# Patient Record
Sex: Female | Born: 1990 | Race: Black or African American | Hispanic: No | Marital: Married | Smoking: Former smoker
Health system: Southern US, Community
[De-identification: ages and names within clinical notes are randomized; demographics above are authoritative.]

## PROBLEM LIST (undated history)

## (undated) DIAGNOSIS — R87629 Unspecified abnormal cytological findings in specimens from vagina: Secondary | ICD-10-CM

## (undated) DIAGNOSIS — Z3490 Encounter for supervision of normal pregnancy, unspecified, unspecified trimester: Principal | ICD-10-CM

## (undated) HISTORY — DX: Unspecified abnormal cytological findings in specimens from vagina: R87.629

## (undated) HISTORY — DX: Encounter for supervision of normal pregnancy, unspecified, unspecified trimester: Z34.90

---

## 2009-10-27 HISTORY — PX: CERVICAL CONE BIOPSY: SUR198

## 2014-10-02 ENCOUNTER — Encounter: Payer: Self-pay | Admitting: Adult Health

## 2014-10-04 ENCOUNTER — Encounter: Payer: Self-pay | Admitting: Adult Health

## 2014-10-04 ENCOUNTER — Ambulatory Visit (INDEPENDENT_AMBULATORY_CARE_PROVIDER_SITE_OTHER): Payer: Medicaid Other | Admitting: Adult Health

## 2014-10-04 VITALS — BP 100/60 | Ht 68.0 in | Wt 124.5 lb

## 2014-10-04 DIAGNOSIS — Z3201 Encounter for pregnancy test, result positive: Secondary | ICD-10-CM

## 2014-10-04 DIAGNOSIS — Z3490 Encounter for supervision of normal pregnancy, unspecified, unspecified trimester: Secondary | ICD-10-CM

## 2014-10-04 HISTORY — DX: Encounter for supervision of normal pregnancy, unspecified, unspecified trimester: Z34.90

## 2014-10-04 LAB — POCT URINE PREGNANCY: PREG TEST UR: POSITIVE

## 2014-10-04 NOTE — Patient Instructions (Signed)
Second Trimester of Pregnancy The second trimester is from week 13 through week 28, months 4 through 6. The second trimester is often a time when you feel your best. Your body has also adjusted to being pregnant, and you begin to feel better physically. Usually, morning sickness has lessened or quit completely, you may have more energy, and you may have an increase in appetite. The second trimester is also a time when the fetus is growing rapidly. At the end of the sixth month, the fetus is about 9 inches long and weighs about 1 pounds. You will likely begin to feel the baby move (quickening) between 18 and 20 weeks of the pregnancy. BODY CHANGES Your body goes through many changes during pregnancy. The changes vary from woman to woman.   Your weight will continue to increase. You will notice your lower abdomen bulging out.  You may begin to get stretch marks on your hips, abdomen, and breasts.  You may develop headaches that can be relieved by medicines approved by your health care provider.  You may urinate more often because the fetus is pressing on your bladder.  You may develop or continue to have heartburn as a result of your pregnancy.  You may develop constipation because certain hormones are causing the muscles that push waste through your intestines to slow down.  You may develop hemorrhoids or swollen, bulging veins (varicose veins).  You may have back pain because of the weight gain and pregnancy hormones relaxing your joints between the bones in your pelvis and as a result of a shift in weight and the muscles that support your balance.  Your breasts will continue to grow and be tender.  Your gums may bleed and may be sensitive to brushing and flossing.  Dark spots or blotches (chloasma, mask of pregnancy) may develop on your face. This will likely fade after the baby is born.  A dark line from your belly button to the pubic area (linea nigra) may appear. This will likely fade  after the baby is born.  You may have changes in your hair. These can include thickening of your hair, rapid growth, and changes in texture. Some women also have hair loss during or after pregnancy, or hair that feels dry or thin. Your hair will most likely return to normal after your baby is born. WHAT TO EXPECT AT YOUR PRENATAL VISITS During a routine prenatal visit:  You will be weighed to make sure you and the fetus are growing normally.  Your blood pressure will be taken.  Your abdomen will be measured to track your baby's growth.  The fetal heartbeat will be listened to.  Any test results from the previous visit will be discussed. Your health care provider may ask you:  How you are feeling.  If you are feeling the baby move.  If you have had any abnormal symptoms, such as leaking fluid, bleeding, severe headaches, or abdominal cramping.  If you have any questions. Other tests that may be performed during your second trimester include:  Blood tests that check for:  Low iron levels (anemia).  Gestational diabetes (between 24 and 28 weeks).  Rh antibodies.  Urine tests to check for infections, diabetes, or protein in the urine.  An ultrasound to confirm the proper growth and development of the baby.  An amniocentesis to check for possible genetic problems.  Fetal screens for spina bifida and Down syndrome. HOME CARE INSTRUCTIONS   Avoid all smoking, herbs, alcohol, and unprescribed   drugs. These chemicals affect the formation and growth of the baby.  Follow your health care provider's instructions regarding medicine use. There are medicines that are either safe or unsafe to take during pregnancy.  Exercise only as directed by your health care provider. Experiencing uterine cramps is a good sign to stop exercising.  Continue to eat regular, healthy meals.  Wear a good support bra for breast tenderness.  Do not use hot tubs, steam rooms, or saunas.  Wear your  seat belt at all times when driving.  Avoid raw meat, uncooked cheese, cat litter boxes, and soil used by cats. These carry germs that can cause birth defects in the baby.  Take your prenatal vitamins.  Try taking a stool softener (if your health care provider approves) if you develop constipation. Eat more high-fiber foods, such as fresh vegetables or fruit and whole grains. Drink plenty of fluids to keep your urine clear or pale yellow.  Take warm sitz baths to soothe any pain or discomfort caused by hemorrhoids. Use hemorrhoid cream if your health care provider approves.  If you develop varicose veins, wear support hose. Elevate your feet for 15 minutes, 3-4 times a day. Limit salt in your diet.  Avoid heavy lifting, wear low heel shoes, and practice good posture.  Rest with your legs elevated if you have leg cramps or low back pain.  Visit your dentist if you have not gone yet during your pregnancy. Use a soft toothbrush to brush your teeth and be gentle when you floss.  A sexual relationship may be continued unless your health care provider directs you otherwise.  Continue to go to all your prenatal visits as directed by your health care provider. SEEK MEDICAL CARE IF:   You have dizziness.  You have mild pelvic cramps, pelvic pressure, or nagging pain in the abdominal area.  You have persistent nausea, vomiting, or diarrhea.  You have a bad smelling vaginal discharge.  You have pain with urination. SEEK IMMEDIATE MEDICAL CARE IF:   You have a fever.  You are leaking fluid from your vagina.  You have spotting or bleeding from your vagina.  You have severe abdominal cramping or pain.  You have rapid weight gain or loss.  You have shortness of breath with chest pain.  You notice sudden or extreme swelling of your face, hands, ankles, feet, or legs.  You have not felt your baby move in over an hour.  You have severe headaches that do not go away with  medicine.  You have vision changes. Document Released: 10/07/2001 Document Revised: 10/18/2013 Document Reviewed: 12/14/2012 [facility] Patient Information 2015 [place], [place]. This information is not intended to replace advice given to you by your health care provider. Make sure you discuss any questions you have with your health care provider. First Trimester of Pregnancy The first trimester of pregnancy is from week 1 until the end of week 12 (months 1 through 3). A week after a sperm fertilizes an egg, the egg will implant on the wall of the uterus. This embryo will begin to develop into a baby. Genes from you and your partner are forming the baby. The female genes determine whether the baby is a boy or a girl. At 6-8 weeks, the eyes and face are formed, and the heartbeat can be seen on ultrasound. At the end of 12 weeks, all the baby's organs are formed.  Now that you are pregnant, you will want to do everything you can to have  a healthy baby. Two of the most important things are to get good prenatal care and to follow your health care provider's instructions. Prenatal care is all the medical care you receive before the baby's birth. This care will help prevent, find, and treat any problems during the pregnancy and childbirth. BODY CHANGES Your body goes through many changes during pregnancy. The changes vary from woman to woman.   You may gain or lose a couple of pounds at first.  You may feel sick to your stomach (nauseous) and throw up (vomit). If the vomiting is uncontrollable, call your health care provider.  You may tire easily.  You may develop headaches that can be relieved by medicines approved by your health care provider.  You may urinate more often. Painful urination may mean you have a bladder infection.  You may develop heartburn as a result of your pregnancy.  You may develop constipation because certain hormones are causing the muscles that push waste through your intestines  to slow down.  You may develop hemorrhoids or swollen, bulging veins (varicose veins).  Your breasts may begin to grow larger and become tender. Your nipples may stick out more, and the tissue that surrounds them (areola) may become darker.  Your gums may bleed and may be sensitive to brushing and flossing.  Dark spots or blotches (chloasma, mask of pregnancy) may develop on your face. This will likely fade after the baby is born.  Your menstrual periods will stop.  You may have a loss of appetite.  You may develop cravings for certain kinds of food.  You may have changes in your emotions from day to day, such as being excited to be pregnant or being concerned that something may go wrong with the pregnancy and baby.  You may have more vivid and strange dreams.  You may have changes in your hair. These can include thickening of your hair, rapid growth, and changes in texture. Some women also have hair loss during or after pregnancy, or hair that feels dry or thin. Your hair will most likely return to normal after your baby is born. WHAT TO EXPECT AT YOUR PRENATAL VISITS During a routine prenatal visit:  You will be weighed to make sure you and the baby are growing normally.  Your blood pressure will be taken.  Your abdomen will be measured to track your baby's growth.  The fetal heartbeat will be listened to starting around week 10 or 12 of your pregnancy.  Test results from any previous visits will be discussed. Your health care provider may ask you:  How you are feeling.  If you are feeling the baby move.  If you have had any abnormal symptoms, such as leaking fluid, bleeding, severe headaches, or abdominal cramping.  If you have any questions. Other tests that may be performed during your first trimester include:  Blood tests to find your blood type and to check for the presence of any previous infections. They will also be used to check for low iron levels (anemia) and  Rh antibodies. Later in the pregnancy, blood tests for diabetes will be done along with other tests if problems develop.  Urine tests to check for infections, diabetes, or protein in the urine.  An ultrasound to confirm the proper growth and development of the baby.  An amniocentesis to check for possible genetic problems.  Fetal screens for spina bifida and Down syndrome.  You may need other tests to make sure you and the baby  are doing well. HOME CARE INSTRUCTIONS  Medicines  Follow your health care provider's instructions regarding medicine use. Specific medicines may be either safe or unsafe to take during pregnancy.  Take your prenatal vitamins as directed.  If you develop constipation, try taking a stool softener if your health care provider approves. Diet  Eat regular, well-balanced meals. Choose a variety of foods, such as meat or vegetable-based protein, fish, milk and low-fat dairy products, vegetables, fruits, and whole grain breads and cereals. Your health care provider will help you determine the amount of weight gain that is right for you.  Avoid raw meat and uncooked cheese. These carry germs that can cause birth defects in the baby.  Eating four or five small meals rather than three large meals a day may help relieve nausea and vomiting. If you start to feel nauseous, eating a few soda crackers can be helpful. Drinking liquids between meals instead of during meals also seems to help nausea and vomiting.  If you develop constipation, eat more high-fiber foods, such as fresh vegetables or fruit and whole grains. Drink enough fluids to keep your urine clear or pale yellow. Activity and Exercise  Exercise only as directed by your health care provider. Exercising will help you:  Control your weight.  Stay in shape.  Be prepared for labor and delivery.  Experiencing pain or cramping in the lower abdomen or low back is a good sign that you should stop exercising. Check  with your health care provider before continuing normal exercises.  Try to avoid standing for long periods of time. Move your legs often if you must stand in one place for a long time.  Avoid heavy lifting.  Wear low-heeled shoes, and practice good posture.  You may continue to have sex unless your health care provider directs you otherwise. Relief of Pain or Discomfort  Wear a good support bra for breast tenderness.   Take warm sitz baths to soothe any pain or discomfort caused by hemorrhoids. Use hemorrhoid cream if your health care provider approves.   Rest with your legs elevated if you have leg cramps or low back pain.  If you develop varicose veins in your legs, wear support hose. Elevate your feet for 15 minutes, 3-4 times a day. Limit salt in your diet. Prenatal Care  Schedule your prenatal visits by the twelfth week of pregnancy. They are usually scheduled monthly at first, then more often in the last 2 months before delivery.  Write down your questions. Take them to your prenatal visits.  Keep all your prenatal visits as directed by your health care provider. Safety  Wear your seat belt at all times when driving.  Make a list of emergency phone numbers, including numbers for family, friends, the hospital, and police and fire departments. General Tips  Ask your health care provider for a referral to a local prenatal education class. Begin classes no later than at the beginning of month 6 of your pregnancy.  Ask for help if you have counseling or nutritional needs during pregnancy. Your health care provider can offer advice or refer you to specialists for help with various needs.  Do not use hot tubs, steam rooms, or saunas.  Do not douche or use tampons or scented sanitary pads.  Do not cross your legs for long periods of time.  Avoid cat litter boxes and soil used by cats. These carry germs that can cause birth defects in the baby and possibly loss of the fetus  by miscarriage or stillbirth.  Avoid all smoking, herbs, alcohol, and medicines not prescribed by your health care provider. Chemicals in these affect the formation and growth of the baby.  Schedule a dentist appointment. At home, brush your teeth with a soft toothbrush and be gentle when you floss. SEEK MEDICAL CARE IF:   You have dizziness.  You have mild pelvic cramps, pelvic pressure, or nagging pain in the abdominal area.  You have persistent nausea, vomiting, or diarrhea.  You have a bad smelling vaginal discharge.  You have pain with urination.  You notice increased swelling in your face, hands, legs, or ankles. SEEK IMMEDIATE MEDICAL CARE IF:   You have a fever.  You are leaking fluid from your vagina.  You have spotting or bleeding from your vagina.  You have severe abdominal cramping or pain.  You have rapid weight gain or loss.  You vomit blood or material that looks like coffee grounds.  You are exposed to [place] measles and have never had them.  You are exposed to fifth disease or chickenpox.  You develop a severe headache.  You have shortness of breath.  You have any kind of trauma, such as from a fall or a car accident. Document Released: 10/07/2001 Document Revised: 02/27/2014 Document Reviewed: 08/23/2013 [facility] Patient Information 2015 [place]. This information is not intended to replace advice given to you by your health care provider. Make sure you discuss any questions you have with your health care provider. Return in 1 days for dating [place]

## 2014-10-04 NOTE — Progress Notes (Signed)
Subjective:     Patient ID: Kristina Suarez, female   DOB: 05/13/1991, 23 y.o.   MRN: [id number]  HPI Kristina Suarez is a 23 year old black female in for UPT.  Review of Systems See HPI Reviewed past medical,surgical, social and family history. Reviewed medications and allergies.     Objective:   Physical Exam BP 100/60 mmHg  Ht 5\' 8"  (1.727 m)  Wt 124 lb 8 oz (56.473 kg)  BMI 18.93 kg/m2  LMP 06/29/2014   UPT +, about 14 weeks by LMP with EDD 04/05/15, medicaid form given, some nausea, no vomiting.  Assessment:     Pregnant +UPT    Plan:     Take prenatal gummies Return in 1 day for dating US Review handout on first and second trimester

## 2014-10-05 ENCOUNTER — Other Ambulatory Visit: Payer: Self-pay | Admitting: Adult Health

## 2014-10-05 ENCOUNTER — Ambulatory Visit (INDEPENDENT_AMBULATORY_CARE_PROVIDER_SITE_OTHER): Payer: Medicaid Other

## 2014-10-05 DIAGNOSIS — O26841 Uterine size-date discrepancy, first trimester: Secondary | ICD-10-CM

## 2014-10-05 DIAGNOSIS — Z3490 Encounter for supervision of normal pregnancy, unspecified, unspecified trimester: Secondary | ICD-10-CM

## 2014-10-05 NOTE — Progress Notes (Signed)
U/S-single IUP with +FCA noted, FHR-181 bpm,,CRL c/w 10+1wks EDD 05/02/2015, cx appears closed (3.2cm), small area of hemorrhage noted within the lower uterine segment=9014mm, bilateral adnexa appears WNL, pt desires NT/IT screening, will be scheduled with New OB appt

## 2014-10-18 ENCOUNTER — Other Ambulatory Visit: Payer: Self-pay | Admitting: Adult Health

## 2014-10-18 DIAGNOSIS — O3680X0 Pregnancy with inconclusive fetal viability, not applicable or unspecified: Secondary | ICD-10-CM

## 2014-10-26 ENCOUNTER — Encounter: Payer: Medicaid Other | Admitting: Advanced Practice Midwife

## 2014-10-26 ENCOUNTER — Other Ambulatory Visit: Payer: Medicaid Other
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
## 2014-10-31 ENCOUNTER — Other Ambulatory Visit: Payer: Medicaid Other

## 2014-10-31 ENCOUNTER — Other Ambulatory Visit: Payer: Self-pay | Admitting: Adult Health

## 2014-10-31 DIAGNOSIS — Z3682 Encounter for antenatal screening for nuchal translucency: Secondary | ICD-10-CM

## 2014-11-01 ENCOUNTER — Other Ambulatory Visit: Payer: Medicaid Other

## 2014-11-01 ENCOUNTER — Ambulatory Visit (INDEPENDENT_AMBULATORY_CARE_PROVIDER_SITE_OTHER): Payer: Medicaid Other

## 2014-11-01 DIAGNOSIS — Z36 Encounter for antenatal screening of mother: Secondary | ICD-10-CM

## 2014-11-01 DIAGNOSIS — Z0184 Encounter for antibody response examination: Secondary | ICD-10-CM

## 2014-11-01 DIAGNOSIS — Z130 Encounter for screening for diseases of the blood and blood-forming organs and certain disorders involving the immune mechanism: Secondary | ICD-10-CM

## 2014-11-01 DIAGNOSIS — Z3682 Encounter for antenatal screening for nuchal translucency: Secondary | ICD-10-CM

## 2014-11-01 DIAGNOSIS — Z114 Encounter for screening for human immunodeficiency virus [HIV]: Secondary | ICD-10-CM

## 2014-11-01 DIAGNOSIS — Z113 Encounter for screening for infections with a predominantly sexual mode of transmission: Secondary | ICD-10-CM

## 2014-11-01 DIAGNOSIS — Z3482 Encounter for supervision of other normal pregnancy, second trimester: Secondary | ICD-10-CM

## 2014-11-01 DIAGNOSIS — Z331 Pregnant state, incidental: Secondary | ICD-10-CM

## 2014-11-01 NOTE — Addendum Note (Signed)
Addended by: Criss AlvinePULLIAM, Keyshon Stein G on: 11/01/2014 02:58 PM   Modules accepted: Orders

## 2014-11-01 NOTE — Progress Notes (Signed)
Nuchal Translucency US performed today.  Single, active female IUP at 19+[redacted] weeks GA in a cephalic presentation.  FHR 132 bpm.  Cervix is closed and measures 3.4 cm.  Fluid is within normal limits with a SVP of 5.24.  Anterior fundal Gr 0 placenta.  Right ovary is within normal limits. Left ovary not visualized. All anatomy visualized and appears normal. EFW today of 362g (13oz) which is consistent with dating.

## 2014-11-02 LAB — CBC
HCT: 37.5 % (ref 36.0–46.0)
Hemoglobin: 12.4 g/dL (ref 12.0–15.0)
MCH: 30.1 pg (ref 26.0–34.0)
MCHC: 33.1 g/dL (ref 30.0–36.0)
MCV: 91 fL (ref 78.0–100.0)
MPV: 11.1 fL (ref 8.6–12.4)
Platelets: 246 10*3/uL (ref 150–400)
RBC: 4.12 MIL/uL (ref 3.87–5.11)
RDW: 14.2 % (ref 11.5–15.5)
WBC: 7.8 10*3/uL (ref 4.0–10.5)

## 2014-11-02 LAB — SICKLE CELL SCREEN: SICKLE CELL SCREEN: NEGATIVE

## 2014-11-02 LAB — RUBELLA SCREEN: Rubella: 1.01 Index — ABNORMAL HIGH (ref <0.90)

## 2014-11-02 LAB — VARICELLA ZOSTER ANTIBODY, IGG: Varicella IgG: 238.6 Index — ABNORMAL HIGH (ref <135.00)

## 2014-11-02 LAB — ABO AND RH: RH TYPE: POSITIVE

## 2014-11-02 LAB — RPR

## 2014-11-02 LAB — HIV ANTIBODY (ROUTINE TESTING W REFLEX): HIV: NONREACTIVE

## 2014-11-02 LAB — HEPATITIS B SURFACE ANTIGEN: Hepatitis B Surface Ag: NEGATIVE

## 2014-11-02 LAB — ANTIBODY SCREEN: ANTIBODY SCREEN: NEGATIVE

## 2014-11-06 ENCOUNTER — Ambulatory Visit (INDEPENDENT_AMBULATORY_CARE_PROVIDER_SITE_OTHER): Payer: Medicaid Other | Admitting: Women's Health

## 2014-11-06 ENCOUNTER — Encounter: Payer: Self-pay | Admitting: Women's Health

## 2014-11-06 VITALS — BP 110/70 | Wt 127.0 lb

## 2014-11-06 DIAGNOSIS — Z3490 Encounter for supervision of normal pregnancy, unspecified, unspecified trimester: Secondary | ICD-10-CM | POA: Insufficient documentation

## 2014-11-06 DIAGNOSIS — Z3492 Encounter for supervision of normal pregnancy, unspecified, second trimester: Secondary | ICD-10-CM

## 2014-11-06 DIAGNOSIS — Z1159 Encounter for screening for other viral diseases: Secondary | ICD-10-CM

## 2014-11-06 DIAGNOSIS — Z118 Encounter for screening for other infectious and parasitic diseases: Secondary | ICD-10-CM

## 2014-11-06 DIAGNOSIS — Z1371 Encounter for nonprocreative screening for genetic disease carrier status: Secondary | ICD-10-CM

## 2014-11-06 DIAGNOSIS — Z331 Pregnant state, incidental: Secondary | ICD-10-CM

## 2014-11-06 DIAGNOSIS — Z1389 Encounter for screening for other disorder: Secondary | ICD-10-CM

## 2014-11-06 DIAGNOSIS — Z0283 Encounter for blood-alcohol and blood-drug test: Secondary | ICD-10-CM

## 2014-11-06 DIAGNOSIS — Z23 Encounter for immunization: Secondary | ICD-10-CM

## 2014-11-06 LAB — POCT URINALYSIS DIPSTICK
Blood, UA: NEGATIVE
Glucose, UA: NEGATIVE
LEUKOCYTES UA: NEGATIVE
Nitrite, UA: NEGATIVE
Protein, UA: NEGATIVE

## 2014-11-06 MED ORDER — DOXYLAMINE-PYRIDOXINE 10-10 MG PO TBEC: DELAYED_RELEASE_TABLET | ORAL | Status: DC

## 2014-11-06 NOTE — Progress Notes (Signed)
  Subjective:  Kristina Suarez is a 24 y.o. 353P2002 African American female at 4765w5d by 10wk u/s, being seen today for her first obstetrical visit.  Her obstetrical history is significant for term uncomplicated SVB x 2 in [place].  Pregnancy history fully reviewed.  Patient reports n/v- would like meds. Denies vb, cramping, uti s/s, abnormal/malodorous vag d/c, or vulvovaginal itching/irritation.  BP 110/70 mmHg  Wt 127 lb (57.607 kg)  LMP 06/29/2014  HISTORY: OB History  Gravida Para Term Preterm AB SAB TAB Ectopic Multiple Living  3 2 2       2     # Outcome Date GA Lbr Len/2nd Weight Sex Delivery Anes PTL Lv  3 Current           2 Term 08/07/13 8665w0d  6 lb 11 oz (3.033 kg) M Vag-Spont  N   1 Term 06/23/09 3965w0d  6 lb 12 oz (3.062 kg) M Vag-Spont  N      Past Medical History  Diagnosis Date  . Vaginal Pap smear, abnormal   . Pregnant 10/04/2014   Past Surgical History  Procedure Laterality Date  . Cervical cone biopsy  2011   Family History  Problem Relation Age of Onset  . Diabetes Maternal Grandmother   . Hypertension Maternal Grandmother   . Other Maternal Grandmother     heart problems  . HIV Mother   . ADD / ADHD Sister     Exam   System:     General: Well developed & nourished, no acute distress   Skin: Warm & dry, normal coloration and turgor, no rashes   Neurologic: Alert & oriented, normal mood   Cardiovascular: Regular rate & rhythm   Respiratory: Effort & rate normal, LCTAB, acyanotic   Abdomen: Soft, non tender   Extremities: normal strength, tone   Pelvic Exam:    Perineum: Normal perineum   Vulva: Normal, no lesions   Vagina:  Normal mucosa, normal discharge   Cervix: Normal, bulbous, appears closed   Uterus: Normal size/shape/contour for GA   Thin prep pap smear neg Nov 2014 in [place]  FHR: 141 via doppler   Assessment:   Pregnancy: [id number] Patient Active Problem List   Diagnosis Date Noted  . Supervision of normal pregnancy 11/06/2014    Priority:  High    7365w5d [id number] New OB visit N/V of pregnancy   Plan:  Initial labs drawn 11/01/13 and reviewed Continue prenatal vitamins Problem list reviewed and updated Reviewed n/v relief measures and warning s/s to report Rx diclegis, prior auth sent, and out of samples so none given.  Reviewed recommended weight gain based on pre-gravid BMI Encouraged well-balanced diet Genetic Screening discussed Integrated Screen: has already had 1st IT/NT Cystic fibrosis screening discussed requested- will do w/ 2nd IT since has already had all other labs Ultrasound discussed; fetal survey: requested Follow up in 2 weeks for 2nd IT and visit [facility] completed  Marge Suarez, Kristina Suarez CNM, RaLPh [facility] CenterWHNP-BC 11/06/2014 2:49 PM

## 2014-11-06 NOTE — Patient Instructions (Addendum)
Nausea & Vomiting  Have saltine crackers or pretzels by your bed and eat a few bites before you raise your head out of bed in the morning  Eat small frequent meals throughout the day instead of large meals  Drink plenty of fluids throughout the day to stay hydrated, just don't drink a lot of fluids with your meals.  This can make your stomach fill up faster making you feel sick  Do not brush your teeth right after you eat  Products with real ginger are good for nausea, like ginger ale and ginger hard candy Make sure it says made with real ginger!  Sucking on sour candy like lemon heads is also good for nausea  If your prenatal vitamins make you nauseated, take them at night so you will sleep through the nausea  Sea Bands  If you feel like you need medicine for the nausea & vomiting please let us know  If you are unable to keep any fluids or food down please let us know    Second Trimester of Pregnancy The second trimester is from week 13 through week 28, months 4 through 6. The second trimester is often a time when you feel your best. Your body has also adjusted to being pregnant, and you begin to feel better physically. Usually, morning sickness has lessened or quit completely, you may have more energy, and you may have an increase in appetite. The second trimester is also a time when the fetus is growing rapidly. At the end of the sixth month, the fetus is about 9 inches long and weighs about 1 pounds. You will likely begin to feel the baby move (quickening) between 18 and 20 weeks of the pregnancy. BODY CHANGES Your body goes through many changes during pregnancy. The changes vary from woman to woman.  11. Your weight will continue to increase. You will notice your lower abdomen bulging out. 12. You may begin to get stretch marks on your hips, abdomen, and breasts. 13. You may develop headaches that can be relieved by medicines approved by your health care provider. 14. You may  urinate more often because the fetus is pressing on your bladder. 15. You may develop or continue to have heartburn as a result of your pregnancy. 16. You may develop constipation because certain hormones are causing the muscles that push waste through your intestines to slow down. 17. You may develop hemorrhoids or swollen, bulging veins (varicose veins). 18. You may have back pain because of the weight gain and pregnancy hormones relaxing your joints between the bones in your pelvis and as a result of a shift in weight and the muscles that support your balance. 19. Your breasts will continue to grow and be tender. 20. Your gums may bleed and may be sensitive to brushing and flossing. 21. Dark spots or blotches (chloasma, mask of pregnancy) may develop on your face. This will likely fade after the baby is born. 22. A dark line from your belly button to the pubic area (linea nigra) may appear. This will likely fade after the baby is born. 23. You may have changes in your hair. These can include thickening of your hair, rapid growth, and changes in texture. Some women also have hair loss during or after pregnancy, or hair that feels dry or thin. Your hair will most likely return to normal after your baby is born. WHAT TO EXPECT AT YOUR PRENATAL VISITS During a routine prenatal visit:  You will be weighed to   make sure you and the fetus are growing normally.  Your blood pressure will be taken.  Your abdomen will be measured to track your baby's growth.  The fetal heartbeat will be listened to.  Any test results from the previous visit will be discussed. Your health care provider may ask you:  How you are feeling.  If you are feeling the baby move.  If you have had any abnormal symptoms, such as leaking fluid, bleeding, severe headaches, or abdominal cramping.  If you have any questions. Other tests that may be performed during your second trimester include:  Blood tests that check  for:  Low iron levels (anemia).  Gestational diabetes (between 24 and 28 weeks).  Rh antibodies.  Urine tests to check for infections, diabetes, or protein in the urine.  An ultrasound to confirm the proper growth and development of the baby.  An amniocentesis to check for possible genetic problems.  Fetal screens for spina bifida and Down syndrome. HOME CARE INSTRUCTIONS   Avoid all smoking, herbs, alcohol, and unprescribed drugs. These chemicals affect the formation and growth of the baby.  Follow your health care provider's instructions regarding medicine use. There are medicines that are either safe or unsafe to take during pregnancy.  Exercise only as directed by your health care provider. Experiencing uterine cramps is a good sign to stop exercising.  Continue to eat regular, healthy meals.  Wear a good support bra for breast tenderness.  Do not use hot tubs, steam rooms, or saunas.  Wear your seat belt at all times when driving.  Avoid raw meat, uncooked cheese, cat litter boxes, and soil used by cats. These carry germs that can cause birth defects in the baby.  Take your prenatal vitamins.  Try taking a stool softener (if your health care provider approves) if you develop constipation. Eat more high-fiber foods, such as fresh vegetables or fruit and whole grains. Drink plenty of fluids to keep your urine clear or pale yellow.  Take warm sitz baths to soothe any pain or discomfort caused by hemorrhoids. Use hemorrhoid cream if your health care provider approves.  If you develop varicose veins, wear support hose. Elevate your feet for 15 minutes, 3-4 times a day. Limit salt in your diet.  Avoid heavy lifting, wear low heel shoes, and practice good posture.  Rest with your legs elevated if you have leg cramps or low back pain.  Visit your dentist if you have not gone yet during your pregnancy. Use a soft toothbrush to brush your teeth and be gentle when you  floss.  A sexual relationship may be continued unless your health care provider directs you otherwise.  Continue to go to all your prenatal visits as directed by your health care provider. SEEK MEDICAL CARE IF:   You have dizziness.  You have mild pelvic cramps, pelvic pressure, or nagging pain in the abdominal area.  You have persistent nausea, vomiting, or diarrhea.  You have a bad smelling vaginal discharge.  You have pain with urination. SEEK IMMEDIATE MEDICAL CARE IF:   You have a fever.  You are leaking fluid from your vagina.  You have spotting or bleeding from your vagina.  You have severe abdominal cramping or pain.  You have rapid weight gain or loss.  You have shortness of breath with chest pain.  You notice sudden or extreme swelling of your face, hands, ankles, feet, or legs.  You have not felt your baby move in over an hour.  You have severe headaches that do not go away with medicine.  You have vision changes. Document Released: 10/07/2001 Document Revised: 10/18/2013 Document Reviewed: 12/14/2012 [facility] Patient Information 2015 [place], [place]. This information is not intended to replace advice given to you by your health care provider. Make sure you discuss any questions you have with your health care provider.

## 2014-11-07 ENCOUNTER — Encounter: Payer: Self-pay | Admitting: Women's Health

## 2014-11-07 DIAGNOSIS — F1290 Cannabis use, unspecified, uncomplicated: Secondary | ICD-10-CM | POA: Insufficient documentation

## 2014-11-07 LAB — DRUG SCREEN, URINE, NO CONFIRMATION
AMPHETAMINE SCRN UR: NEGATIVE
BENZODIAZEPINES.: NEGATIVE
Barbiturate Quant, Ur: NEGATIVE
COCAINE METABOLITES: NEGATIVE
Creatinine,U: 165.2 mg/dL
METHADONE: NEGATIVE
Marijuana Metabolite: POSITIVE — AB
Opiate Screen, Urine: NEGATIVE
Phencyclidine (PCP): NEGATIVE
Propoxyphene: NEGATIVE

## 2014-11-07 LAB — URINALYSIS, ROUTINE W REFLEX MICROSCOPIC
Bilirubin Urine: NEGATIVE
Glucose, UA: NEGATIVE mg/dL
Hgb urine dipstick: NEGATIVE
Ketones, ur: >80 mg/dL — AB
Leukocytes, UA: NEGATIVE
NITRITE: NEGATIVE
Protein, ur: NEGATIVE mg/dL
SPECIFIC GRAVITY, URINE: 1.021 (ref 1.005–1.030)
UROBILINOGEN UA: 1 mg/dL (ref 0.0–1.0)
pH: 7 (ref 5.0–8.0)

## 2014-11-07 LAB — GC/CHLAMYDIA PROBE AMP
CT Probe RNA: NEGATIVE
GC Probe RNA: NEGATIVE

## 2014-11-07 LAB — OXYCODONE SCREEN, UA, RFLX CONFIRM: OXYCODONE SCRN UR: NEGATIVE ng/mL

## 2014-11-08 LAB — URINE CULTURE: Colony Count: 4000

## 2014-11-09 LAB — MATERNAL SCREEN, INTEGRATED #1

## 2014-11-21 ENCOUNTER — Encounter: Payer: Medicaid Other | Admitting: Advanced Practice Midwife

## 2014-11-21 ENCOUNTER — Ambulatory Visit (INDEPENDENT_AMBULATORY_CARE_PROVIDER_SITE_OTHER): Payer: Medicaid Other | Admitting: Advanced Practice Midwife

## 2014-11-21 VITALS — BP 96/72 | Wt 130.0 lb

## 2014-11-21 DIAGNOSIS — Z3492 Encounter for supervision of normal pregnancy, unspecified, second trimester: Secondary | ICD-10-CM

## 2014-11-21 DIAGNOSIS — Z331 Pregnant state, incidental: Secondary | ICD-10-CM

## 2014-11-21 DIAGNOSIS — Z3682 Encounter for antenatal screening for nuchal translucency: Secondary | ICD-10-CM

## 2014-11-21 DIAGNOSIS — Z363 Encounter for antenatal screening for malformations: Secondary | ICD-10-CM

## 2014-11-21 DIAGNOSIS — Z1389 Encounter for screening for other disorder: Secondary | ICD-10-CM

## 2014-11-21 LAB — POCT URINALYSIS DIPSTICK
Blood, UA: NEGATIVE
Glucose, UA: NEGATIVE
Ketones, UA: NEGATIVE
LEUKOCYTES UA: NEGATIVE
Nitrite, UA: NEGATIVE
PROTEIN UA: NEGATIVE

## 2014-11-21 MED ORDER — DOXYLAMINE-PYRIDOXINE 10-10 MG PO TBEC: DELAYED_RELEASE_TABLET | ORAL | Status: DC

## 2014-11-21 NOTE — Progress Notes (Signed)
[id number] 5872w6d Estimated Date of Delivery: 05/02/15  Blood pressure 96/72, weight 130 lb (58.968 kg), last menstrual period 06/29/2014.   BP weight and urine results all reviewed and noted.  Please refer to the obstetrical flow sheet for the fundal height and fetal heart rate documentation:  Patient reports good fetal movement, denies any bleeding and no rupture of membranes symptoms or regular contractions. Patient is without complaints. All questions were answered.  Plan:  Continued routine obstetrical care,   Follow up in 3 weeks for OB appointment,anatomy scan

## 2014-11-24 LAB — MATERNAL SCREEN, INTEGRATED #2
AFP MoM: 0.78
AFP, Serum: 35.2 ng/mL
CROWN RUMP LENGTH MAT SCREEN 2: 83.7 mm
Calculated Gestational Age: 16.7
ESTRIOL FREE MAT SCREEN: 1.11 ng/mL
Estriol Mom: 1.07
INHIBIN A DIMERIC MAT SCREEN: 120 pg/mL
INHIBIN A MOM MAT SCREEN: 0.69
MSS Down Syndrome: <1:5000 {titer}
NT MoM: 0.93
NUMBER OF FETUSES MAT SCREEN 2: 1
Nuchal Translucency: 1.7 mm
PAPP-A MOM MAT SCREEN: 0.37
PAPP-A: 698 ng/mL
Rish for ONTD: <1:5000 {titer}
hCG MoM: 0.47
hCG, Serum: 18.8 IU/mL

## 2014-12-12 ENCOUNTER — Encounter: Payer: Medicaid Other | Admitting: Women's Health

## 2014-12-12 ENCOUNTER — Ambulatory Visit (INDEPENDENT_AMBULATORY_CARE_PROVIDER_SITE_OTHER): Payer: Medicaid Other

## 2014-12-12 ENCOUNTER — Other Ambulatory Visit: Payer: Self-pay | Admitting: Advanced Practice Midwife

## 2014-12-12 DIAGNOSIS — Z3689 Encounter for other specified antenatal screening: Secondary | ICD-10-CM

## 2014-12-12 DIAGNOSIS — Z363 Encounter for antenatal screening for malformations: Secondary | ICD-10-CM

## 2014-12-12 DIAGNOSIS — Z36 Encounter for antenatal screening of mother: Secondary | ICD-10-CM

## 2014-12-12 DIAGNOSIS — O99322 Drug use complicating pregnancy, second trimester: Secondary | ICD-10-CM

## 2014-12-12 NOTE — Progress Notes (Signed)
U/S(19+6wks)-active fetus, meas c/w dates, fluid WNL, fundal Anterior Gr 0 placenta, cx appears closed ( 3.7cm), bilateral adnexa appears WNL, FHR- 142 bpm, female fetus, bilateral hyperechoic cardiac ventricle foci noted all other cardiac anatomy appears WNL, no other abnl noted

## 2014-12-18 ENCOUNTER — Encounter: Payer: Medicaid Other | Admitting: Women's Health

## 2014-12-21 ENCOUNTER — Telehealth: Payer: Self-pay | Admitting: Radiology

## 2014-12-22 ENCOUNTER — Encounter: Payer: Medicaid Other | Admitting: Obstetrics and Gynecology

## 2014-12-25 ENCOUNTER — Encounter: Payer: Self-pay | Admitting: Obstetrics and Gynecology

## 2014-12-25 ENCOUNTER — Ambulatory Visit (INDEPENDENT_AMBULATORY_CARE_PROVIDER_SITE_OTHER): Payer: Medicaid Other | Admitting: Obstetrics and Gynecology

## 2014-12-25 VITALS — BP 104/60 | HR 80 | Wt 132.2 lb

## 2014-12-25 DIAGNOSIS — Z308 Encounter for other contraceptive management: Secondary | ICD-10-CM

## 2014-12-25 DIAGNOSIS — Z309 Encounter for contraceptive management, unspecified: Secondary | ICD-10-CM | POA: Insufficient documentation

## 2014-12-25 DIAGNOSIS — Z331 Pregnant state, incidental: Secondary | ICD-10-CM

## 2014-12-25 DIAGNOSIS — Z1389 Encounter for screening for other disorder: Secondary | ICD-10-CM

## 2014-12-25 DIAGNOSIS — Z3492 Encounter for supervision of normal pregnancy, unspecified, second trimester: Secondary | ICD-10-CM

## 2014-12-25 LAB — POCT URINALYSIS DIPSTICK
Blood, UA: NEGATIVE
Glucose, UA: 3
Ketones, UA: NEGATIVE
Leukocytes, UA: NEGATIVE
Nitrite, UA: NEGATIVE

## 2014-12-25 NOTE — Progress Notes (Signed)
Patient ID: Kristina Suarez, female   DOB: 08/11/1991, 24 y.o.   MRN: [id number]  [id number] 4550w5d Estimated Date of Delivery: 05/02/15  Blood pressure 104/60, weight 132 lb 3.2 oz (59.966 kg), last menstrual period 06/29/2014.  Pt desires postpartum tubal ligation while in hospital.discussed pro/con of various techniques  refer to the ob flow sheet for FH and FHR, also BP, Wt, Urine results:notable for negative  Patient reports + good fetal movement, denies any bleeding and no rupture of membranes symptoms or regular contractions. Patient complaints: none.  FHR: 138  Questions were answered. Assessment: 6050w5d, [id number] Plan:  Continued routine obstetrical care,  Discussed tubal ligation including timing and technique Salpingectomy vs. tube clips discussed  F/u in 2 weeks for routine care  This chart was scribed for Tilda BurrowJohn Heydi Swango V, MD by Gwenyth Oberatherine Macek, ED Scribe. This patient was seen in room 3 and the patient's care was started at 10:03 AM.   I personally performed the services described in this documentation, which was SCRIBED in my presence. The recorded information has been reviewed and considered accurate. It has been edited as necessary during review. Tilda BurrowFERGUSON,Sharlena Kristensen V, MD

## 2015-01-22 ENCOUNTER — Encounter: Payer: Medicaid Other | Admitting: Women's Health

## 2015-01-29 ENCOUNTER — Encounter: Payer: Medicaid Other | Admitting: Women's Health

## 2015-01-30 ENCOUNTER — Encounter: Payer: Medicaid Other | Admitting: Women's Health

## 2015-02-06 ENCOUNTER — Encounter: Payer: Medicaid Other | Admitting: Women's Health

## 2015-02-07 ENCOUNTER — Ambulatory Visit (INDEPENDENT_AMBULATORY_CARE_PROVIDER_SITE_OTHER): Payer: Medicaid Other | Admitting: Women's Health

## 2015-02-07 ENCOUNTER — Encounter: Payer: Self-pay | Admitting: Women's Health

## 2015-02-07 VITALS — BP 112/74 | HR 64 | Wt 138.0 lb

## 2015-02-07 DIAGNOSIS — N898 Other specified noninflammatory disorders of vagina: Secondary | ICD-10-CM

## 2015-02-07 DIAGNOSIS — O0930 Supervision of pregnancy with insufficient antenatal care, unspecified trimester: Secondary | ICD-10-CM | POA: Insufficient documentation

## 2015-02-07 DIAGNOSIS — O26843 Uterine size-date discrepancy, third trimester: Secondary | ICD-10-CM

## 2015-02-07 DIAGNOSIS — Z331 Pregnant state, incidental: Secondary | ICD-10-CM

## 2015-02-07 DIAGNOSIS — Z3493 Encounter for supervision of normal pregnancy, unspecified, third trimester: Secondary | ICD-10-CM

## 2015-02-07 DIAGNOSIS — O26893 Other specified pregnancy related conditions, third trimester: Secondary | ICD-10-CM

## 2015-02-07 DIAGNOSIS — B373 Candidiasis of vulva and vagina: Secondary | ICD-10-CM

## 2015-02-07 DIAGNOSIS — F1290 Cannabis use, unspecified, uncomplicated: Secondary | ICD-10-CM

## 2015-02-07 DIAGNOSIS — O283 Abnormal ultrasonic finding on antenatal screening of mother: Secondary | ICD-10-CM

## 2015-02-07 DIAGNOSIS — Z1389 Encounter for screening for other disorder: Secondary | ICD-10-CM

## 2015-02-07 LAB — POCT URINALYSIS DIPSTICK
Ketones, UA: NEGATIVE
NITRITE UA: NEGATIVE
Protein, UA: NEGATIVE
RBC UA: NEGATIVE

## 2015-02-07 LAB — POCT WET PREP (WET MOUNT): Clue Cells Wet Prep Whiff POC: NEGATIVE

## 2015-02-07 MED ORDER — TERCONAZOLE 0.4 % VA CREA: 1.0000 | TOPICAL_CREAM | Freq: Every day | VAGINAL | Status: DC

## 2015-02-07 NOTE — Progress Notes (Signed)
Low-risk OB appointment [id number] 448w0d Estimated Date of Delivery: 05/02/15 BP 112/74 mmHg  Pulse 64  Wt 138 lb (62.596 kg)  LMP 06/29/2014  BP, weight, and urine reviewed.  Refer to obstetrical flow sheet for FH & FHR.  Reports good fm.  Denies regular uc's, lof, vb, or uti s/s. Yellow d/c w/ vulvar itching x few weeks. No care since 21wks d/t 'things coming up' Spec exam: thick yellow d/c adherent to vag walls, wet prep: +yeast, rx terazole 7 Reviewed ptl s/s, fkc. Recommended Tdap at HD/PCP per CDC guidelines.  Plan:  Afi/efw u/s asap, Continue routine obstetrical care  F/U in asap for afi/efw for s<d and follow-up anatomy u/s for bilateral EICF, and pn2 (no visit), then 4wks for OB appointment

## 2015-02-07 NOTE — Patient Instructions (Addendum)
You will have your sugar test next visit.  Please do not eat or drink anything after midnight the night before you come, not even water.  You will be here for at least two hours.     Circumcision: $507 at hospital, $244 at [facility], has to be paid up front before it is done. If you want the circumcision done at [facility] you can make payments during pregnancy. If you are interested in this, see receptionist at check-out.  If your baby is older than 28 days when you have the circumcision done at [facility], the fee will go up to $325.50.    Call the office [phone]) or go to [facility] if:  You begin to have strong, frequent contractions  Your water breaks.  Sometimes it is a big gush of fluid, sometimes it is just a trickle that keeps getting your panties wet or running down your legs  You have vaginal bleeding.  It is normal to have a small amount of spotting if your cervix was checked.   You don't feel your baby moving like normal.  If you don't, get you something to eat and drink and lay down and focus on feeling your baby move.  You should feel at least 10 movements in 2 hours.  If you don't, you should call the office or go to [facility].    Tdap Vaccine  It is recommended that you get the Tdap vaccine during the third trimester of EACH pregnancy to help protect your baby from getting pertussis (whooping cough)  27-36 weeks is the BEST time to do this so that you can pass the protection on to your baby. During pregnancy is better than after pregnancy, but if you are unable to get it during pregnancy it will be offered at the hospital.   You can get this vaccine at the health department or your family doctor  Everyone who will be around your baby should also be up-to-date on their vaccines. Adults (who are not pregnant) only need 1 dose of Tdap during adulthood.   Third Trimester of Pregnancy The third trimester is from week 29 through week 42, months 7 through 9. The  third trimester is a time when the fetus is growing rapidly. At the end of the ninth month, the fetus is about 20 inches in length and weighs 6-10 pounds.  BODY CHANGES Your body goes through many changes during pregnancy. The changes vary from woman to woman.   Your weight will continue to increase. You can expect to gain 25-35 pounds (11-16 kg) by the end of the pregnancy.  You may begin to get stretch marks on your hips, abdomen, and breasts.  You may urinate more often because the fetus is moving lower into your pelvis and pressing on your bladder.  You may develop or continue to have heartburn as a result of your pregnancy.  You may develop constipation because certain hormones are causing the muscles that push waste through your intestines to slow down.  You may develop hemorrhoids or swollen, bulging veins (varicose veins).  You may have pelvic pain because of the weight gain and pregnancy hormones relaxing your joints between the bones in your pelvis. Backaches may result from overexertion of the muscles supporting your posture.  You may have changes in your hair. These can include thickening of your hair, rapid growth, and changes in texture. Some women also have hair loss during or after pregnancy, or hair that feels dry  or thin. Your hair will most likely return to normal after your baby is born.  Your breasts will continue to grow and be tender. A yellow discharge may leak from your breasts called colostrum.  Your belly button may stick out.  You may feel short of breath because of your expanding uterus.  You may notice the fetus "dropping," or moving lower in your abdomen.  You may have a bloody mucus discharge. This usually occurs a few days to a week before labor begins.  Your cervix becomes thin and soft (effaced) near your due date. WHAT TO EXPECT AT YOUR PRENATAL EXAMS  You will have prenatal exams every 2 weeks until week 36. Then, you will have weekly prenatal  exams. During a routine prenatal visit:  You will be weighed to make sure you and the fetus are growing normally.  Your blood pressure is taken.  Your abdomen will be measured to track your baby's growth.  The fetal heartbeat will be listened to.  Any test results from the previous visit will be discussed.  You may have a cervical check near your due date to see if you have effaced. At around 36 weeks, your caregiver will check your cervix. At the same time, your caregiver will also perform a test on the secretions of the vaginal tissue. This test is to determine if a type of bacteria, Group B streptococcus, is present. Your caregiver will explain this further. Your caregiver may ask you:  What your birth plan is.  How you are feeling.  If you are feeling the baby move.  If you have had any abnormal symptoms, such as leaking fluid, bleeding, severe headaches, or abdominal cramping.  If you have any questions. Other tests or screenings that may be performed during your third trimester include:  Blood tests that check for low iron levels (anemia).  Fetal testing to check the health, activity level, and growth of the fetus. Testing is done if you have certain medical conditions or if there are problems during the pregnancy. FALSE LABOR You may feel small, irregular contractions that eventually go away. These are called Braxton Hicks contractions, or false labor. Contractions may last for hours, days, or even weeks before true labor sets in. If contractions come at regular intervals, intensify, or become painful, it is best to be seen by your caregiver.  SIGNS OF LABOR   Menstrual-like cramps.  Contractions that are 5 minutes apart or less.  Contractions that start on the top of the uterus and spread down to the lower abdomen and back.  A sense of increased pelvic pressure or back pain.  A watery or bloody mucus discharge that comes from the vagina. If you have any of these  signs before the 37th week of pregnancy, call your caregiver right away. You need to go to the hospital to get checked immediately. HOME CARE INSTRUCTIONS   Avoid all smoking, herbs, alcohol, and unprescribed drugs. These chemicals affect the formation and growth of the baby.  Follow your caregiver's instructions regarding medicine use. There are medicines that are either safe or unsafe to take during pregnancy.  Exercise only as directed by your caregiver. Experiencing uterine cramps is a good sign to stop exercising.  Continue to eat regular, healthy meals.  Wear a good support bra for breast tenderness.  Do not use hot tubs, steam rooms, or saunas.  Wear your seat belt at all times when driving.  Avoid raw meat, uncooked cheese, cat litter boxes, and  soil used by cats. These carry germs that can cause birth defects in the baby.  Take your prenatal vitamins.  Try taking a stool softener (if your caregiver approves) if you develop constipation. Eat more high-fiber foods, such as fresh vegetables or fruit and whole grains. Drink plenty of fluids to keep your urine clear or pale yellow.  Take warm sitz baths to soothe any pain or discomfort caused by hemorrhoids. Use hemorrhoid cream if your caregiver approves.  If you develop varicose veins, wear support hose. Elevate your feet for 15 minutes, 3-4 times a day. Limit salt in your diet.  Avoid heavy lifting, wear low heal shoes, and practice good posture.  Rest a lot with your legs elevated if you have leg cramps or low back pain.  Visit your dentist if you have not gone during your pregnancy. Use a soft toothbrush to brush your teeth and be gentle when you floss.  A sexual relationship may be continued unless your caregiver directs you otherwise.  Do not travel far distances unless it is absolutely necessary and only with the approval of your caregiver.  Take prenatal classes to understand, practice, and ask questions about the  labor and delivery.  Make a trial run to the hospital.  Pack your hospital bag.  Prepare the baby's nursery.  Continue to go to all your prenatal visits as directed by your caregiver. SEEK MEDICAL CARE IF:  You are unsure if you are in labor or if your water has broken.  You have dizziness.  You have mild pelvic cramps, pelvic pressure, or nagging pain in your abdominal area.  You have persistent nausea, vomiting, or diarrhea.  You have a bad smelling vaginal discharge.  You have pain with urination. SEEK IMMEDIATE MEDICAL CARE IF:   You have a fever.  You are leaking fluid from your vagina.  You have spotting or bleeding from your vagina.  You have severe abdominal cramping or pain.  You have rapid weight loss or gain.  You have shortness of breath with chest pain.  You notice sudden or extreme swelling of your face, hands, ankles, feet, or legs.  You have not felt your baby move in over an hour.  You have severe headaches that do not go away with medicine.  You have vision changes. Document Released: 10/07/2001 Document Revised: 10/18/2013 Document Reviewed: 12/14/2012 [facility] Patient Information 2015 [place]. This information is not intended to replace advice given to you by your health care provider. Make sure you discuss any questions you have with your health care provider.

## 2015-02-08 ENCOUNTER — Other Ambulatory Visit: Payer: Medicaid Other

## 2015-02-08 DIAGNOSIS — Z131 Encounter for screening for diabetes mellitus: Secondary | ICD-10-CM

## 2015-02-08 DIAGNOSIS — Z369 Encounter for antenatal screening, unspecified: Secondary | ICD-10-CM

## 2015-02-08 LAB — PMP SCREEN PROFILE (10S), URINE
Amphetamine Screen, Ur: NEGATIVE ng/mL
BARBITURATE SCRN UR: NEGATIVE ng/mL
Benzodiazepine Screen, Urine: NEGATIVE ng/mL
CREATININE(CRT), U: 137.8 mg/dL (ref 20.0–300.0)
Cannabinoids Ur Ql Scn: POSITIVE ng/mL
Cocaine(Metab.)Screen, Urine: NEGATIVE ng/mL
Methadone Scn, Ur: NEGATIVE ng/mL
OPIATE SCRN UR: NEGATIVE ng/mL
Oxycodone+Oxymorphone Ur Ql Scn: NEGATIVE ng/mL
PCP Scrn, Ur: NEGATIVE ng/mL
PH UR, DRUG SCRN: 7 (ref 4.5–8.9)
PROPOXYPHENE SCREEN: NEGATIVE ng/mL

## 2015-02-09 LAB — CBC
HCT: 36.5 % (ref 34.0–46.6)
Hemoglobin: 11.8 g/dL (ref 11.1–15.9)
MCH: 28.9 pg (ref 26.6–33.0)
MCHC: 32.3 g/dL (ref 31.5–35.7)
MCV: 90 fL (ref 79–97)
PLATELETS: 277 10*3/uL (ref 150–379)
RBC: 4.08 x10E6/uL (ref 3.77–5.28)
RDW: 14.7 % (ref 12.3–15.4)
WBC: 10.3 10*3/uL (ref 3.4–10.8)

## 2015-02-09 LAB — HIV ANTIBODY (ROUTINE TESTING W REFLEX): HIV SCREEN 4TH GENERATION: NONREACTIVE

## 2015-02-09 LAB — GLUCOSE TOLERANCE, 2 HOURS W/ 1HR
GLUCOSE, 1 HOUR: 94 mg/dL (ref 65–179)
GLUCOSE, 2 HOUR: 83 mg/dL (ref 65–152)
GLUCOSE, FASTING: 66 mg/dL (ref 65–91)

## 2015-02-09 LAB — ANTIBODY SCREEN: Antibody Screen: NEGATIVE

## 2015-02-09 LAB — HSV 2 ANTIBODY, IGG: HSV 2 Glycoprotein G Ab, IgG: <0.91 index (ref 0.00–0.90)

## 2015-02-09 LAB — RPR: RPR: NONREACTIVE

## 2015-02-14 ENCOUNTER — Ambulatory Visit (INDEPENDENT_AMBULATORY_CARE_PROVIDER_SITE_OTHER): Payer: Medicaid Other

## 2015-02-14 DIAGNOSIS — O283 Abnormal ultrasonic finding on antenatal screening of mother: Secondary | ICD-10-CM

## 2015-02-14 DIAGNOSIS — O26843 Uterine size-date discrepancy, third trimester: Secondary | ICD-10-CM | POA: Diagnosis not present

## 2015-02-14 DIAGNOSIS — Z3493 Encounter for supervision of normal pregnancy, unspecified, third trimester: Secondary | ICD-10-CM

## 2015-02-14 DIAGNOSIS — O0930 Supervision of pregnancy with insufficient antenatal care, unspecified trimester: Secondary | ICD-10-CM

## 2015-02-14 NOTE — Progress Notes (Signed)
US 29w measurement c/w dates, cephalic, ant fundal pl grade 0,EIF is still seen in left ventricle, afi 13cm,cx appears closed limited view ? 2.7cm

## 2015-02-27 ENCOUNTER — Encounter: Payer: Self-pay | Admitting: Women's Health

## 2015-02-27 DIAGNOSIS — O358XX0 Maternal care for other (suspected) fetal abnormality and damage, not applicable or unspecified: Secondary | ICD-10-CM | POA: Insufficient documentation

## 2015-02-27 DIAGNOSIS — O35BXX0 Maternal care for other (suspected) fetal abnormality and damage, fetal cardiac anomalies, not applicable or unspecified: Secondary | ICD-10-CM | POA: Insufficient documentation

## 2015-03-07 ENCOUNTER — Encounter: Payer: Self-pay | Admitting: Advanced Practice Midwife

## 2015-03-07 ENCOUNTER — Ambulatory Visit (INDEPENDENT_AMBULATORY_CARE_PROVIDER_SITE_OTHER): Payer: Medicaid Other | Admitting: Advanced Practice Midwife

## 2015-03-07 VITALS — BP 104/58 | HR 72 | Wt 144.0 lb

## 2015-03-07 DIAGNOSIS — Z3493 Encounter for supervision of normal pregnancy, unspecified, third trimester: Secondary | ICD-10-CM

## 2015-03-07 DIAGNOSIS — Z1389 Encounter for screening for other disorder: Secondary | ICD-10-CM

## 2015-03-07 DIAGNOSIS — Z331 Pregnant state, incidental: Secondary | ICD-10-CM

## 2015-03-07 LAB — POCT URINALYSIS DIPSTICK
Blood, UA: NEGATIVE
Glucose, UA: NEGATIVE
KETONES UA: NEGATIVE
Nitrite, UA: NEGATIVE
Protein, UA: NEGATIVE

## 2015-03-07 NOTE — Progress Notes (Signed)
[id number] 7082w0d Estimated Date of Delivery: 05/02/15  Blood pressure 104/58, pulse 72, weight 144 lb (65.318 kg), last menstrual period 06/29/2014.   BP weight and urine results all reviewed and noted.  Please refer to the obstetrical flow sheet for the fundal height and fetal heart rate documentation:  Patient reports good fetal movement, denies any bleeding and no rupture of membranes symptoms or regular contractions. Patient is without complaints. All questions were answered.  Plan:  Continued routine obstetrical care,   Follow up in 2 weeks for OB appointment,

## 2015-03-21 ENCOUNTER — Ambulatory Visit (INDEPENDENT_AMBULATORY_CARE_PROVIDER_SITE_OTHER): Payer: Medicaid Other | Admitting: Women's Health

## 2015-03-21 ENCOUNTER — Encounter: Payer: Self-pay | Admitting: Women's Health

## 2015-03-21 VITALS — BP 100/70 | HR 72 | Wt 142.0 lb

## 2015-03-21 DIAGNOSIS — Z1389 Encounter for screening for other disorder: Secondary | ICD-10-CM

## 2015-03-21 DIAGNOSIS — O283 Abnormal ultrasonic finding on antenatal screening of mother: Secondary | ICD-10-CM

## 2015-03-21 DIAGNOSIS — O26843 Uterine size-date discrepancy, third trimester: Secondary | ICD-10-CM

## 2015-03-21 DIAGNOSIS — Z3493 Encounter for supervision of normal pregnancy, unspecified, third trimester: Secondary | ICD-10-CM

## 2015-03-21 DIAGNOSIS — Z331 Pregnant state, incidental: Secondary | ICD-10-CM

## 2015-03-21 LAB — POCT URINALYSIS DIPSTICK
Blood, UA: NEGATIVE
Glucose, UA: NEGATIVE
Ketones, UA: NEGATIVE
Leukocytes, UA: NEGATIVE
NITRITE UA: NEGATIVE
PROTEIN UA: NEGATIVE

## 2015-03-21 NOTE — Progress Notes (Signed)
Low-risk OB appointment [id number] 3418w0d Estimated Date of Delivery: 05/02/15 BP 100/70 mmHg  Pulse 72  Wt 142 lb (64.411 kg)  LMP 06/29/2014  BP, weight, and urine reviewed.  Refer to obstetrical flow sheet for FH & FHR.  Reports good fm.  Denies regular uc's, lof, vb, or uti s/s. No complaints. [place] [facility]. Used to staying in hospital x 3d for normal SVB, [facility]  Reviewed ptl s/s, fkc. Plan:  Continue routine obstetrical care  F/U in asap for efw/afi u/s for s<d, also can recheck eicf (no visit), then 2wks for OB appointment as long as u/s normal

## 2015-03-21 NOTE — Patient Instructions (Addendum)
Call the office [phone]) or go to [facility] if:  You begin to have strong, frequent contractions  Your water breaks.  Sometimes it is a big gush of fluid, sometimes it is just a trickle that keeps getting your panties wet or running down your legs  You have vaginal bleeding.  It is normal to have a small amount of spotting if your cervix was checked.   You don't feel your baby moving like normal.  If you don't, get you something to eat and drink and lay down and focus on feeling your baby move.  You should feel at least 10 movements in 2 hours.  If you don't, you should call the office or go to [facility].    Circumcision: $507 at hospital, $244 at [facility], has to be paid up front before it is done. If you want the circumcision done at [facility] you can make payments during pregnancy. If you are interested in this, see receptionist at check-out.  If your baby is older than 28 days when you have the circumcision done at [facility], the fee will go up to $325.50.    Preterm Labor Information Preterm labor is when labor starts at less than 37 weeks of pregnancy. The normal length of a pregnancy is 39 to 41 weeks. CAUSES Often, there is no identifiable underlying cause as to why a woman goes into preterm labor. One of the most common known causes of preterm labor is infection. Infections of the uterus, cervix, vagina, amniotic sac, bladder, kidney, or even the lungs (pneumonia) can cause labor to start. Other suspected causes of preterm labor include:   Urogenital infections, such as yeast infections and bacterial vaginosis.   Uterine abnormalities (uterine shape, uterine septum, fibroids, or bleeding from the placenta).   A cervix that has been operated on (it may fail to stay closed).   Malformations in the fetus.   Multiple gestations (twins, triplets, and so on).   Breakage of the amniotic sac.  RISK FACTORS  Having a previous history of preterm labor.    Having premature rupture of membranes (PROM).   Having a placenta that covers the opening of the cervix (placenta previa).   Having a placenta that separates from the uterus (placental abruption).   Having a cervix that is too weak to hold the fetus in the uterus (incompetent cervix).   Having too much fluid in the amniotic sac (polyhydramnios).   Taking illegal drugs or smoking while pregnant.   Not gaining enough weight while pregnant.   Being younger than 31 and older than 26 years old.   Having a low socioeconomic status.   Being African American. SYMPTOMS Signs and symptoms of preterm labor include:   Menstrual-like cramps, abdominal pain, or back pain.  Uterine contractions that are regular, as frequent as six in an hour, regardless of their intensity (may be mild or painful).  Contractions that start on the top of the uterus and spread down to the lower abdomen and back.   A sense of increased pelvic pressure.   A watery or bloody mucus discharge that comes from the vagina.  TREATMENT Depending on the length of the pregnancy and other circumstances, your health care provider may suggest bed rest. If necessary, there are medicines that can be given to stop contractions and to mature the fetal lungs. If labor happens before 34 weeks of pregnancy, a prolonged hospital stay may be recommended. Treatment depends on the condition of both you  and the fetus.  WHAT SHOULD YOU DO IF YOU THINK YOU ARE IN PRETERM LABOR? Call your health care provider right away. You will need to go to the hospital to get checked immediately. HOW CAN YOU PREVENT PRETERM LABOR IN FUTURE PREGNANCIES? You should:   Stop smoking if you smoke.  Maintain healthy weight gain and avoid chemicals and drugs that are not necessary.  Be watchful for any type of infection.  Inform your health care provider if you have a known history of preterm labor. Document Released: 01/03/2004 Document  Revised: 06/15/2013 Document Reviewed: 11/15/2012 [facility] Patient Information 2015 [employer] [place]. This information is not intended to replace advice given to you by your health care provider. Make sure you discuss any questions you have with your health care provider.

## 2015-03-23 ENCOUNTER — Ambulatory Visit (INDEPENDENT_AMBULATORY_CARE_PROVIDER_SITE_OTHER): Payer: Medicaid Other

## 2015-03-23 DIAGNOSIS — O26843 Uterine size-date discrepancy, third trimester: Secondary | ICD-10-CM | POA: Diagnosis not present

## 2015-03-23 DIAGNOSIS — O283 Abnormal ultrasonic finding on antenatal screening of mother: Secondary | ICD-10-CM

## 2015-03-23 DIAGNOSIS — O0930 Supervision of pregnancy with insufficient antenatal care, unspecified trimester: Secondary | ICD-10-CM

## 2015-03-23 DIAGNOSIS — Z3493 Encounter for supervision of normal pregnancy, unspecified, third trimester: Secondary | ICD-10-CM

## 2015-03-23 NOTE — Progress Notes (Signed)
US 34+2WKS measurement c/w dates,efw 2472g 50.8%,cephalic,ant fundal pl gr 1,normal ov's,ECF still seen in lt vent n/c,afi 16.4cm

## 2015-04-04 ENCOUNTER — Ambulatory Visit (INDEPENDENT_AMBULATORY_CARE_PROVIDER_SITE_OTHER): Payer: Medicaid Other | Admitting: Women's Health

## 2015-04-04 ENCOUNTER — Encounter: Payer: Self-pay | Admitting: Women's Health

## 2015-04-04 VITALS — BP 110/70 | HR 74 | Wt 147.4 lb

## 2015-04-04 DIAGNOSIS — Z3493 Encounter for supervision of normal pregnancy, unspecified, third trimester: Secondary | ICD-10-CM

## 2015-04-04 DIAGNOSIS — Z331 Pregnant state, incidental: Secondary | ICD-10-CM

## 2015-04-04 DIAGNOSIS — Z1389 Encounter for screening for other disorder: Secondary | ICD-10-CM

## 2015-04-04 LAB — POCT URINALYSIS DIPSTICK
Blood, UA: NEGATIVE
GLUCOSE UA: NEGATIVE
Ketones, UA: NEGATIVE
Leukocytes, UA: NEGATIVE
NITRITE UA: NEGATIVE
Protein, UA: NEGATIVE

## 2015-04-04 NOTE — Progress Notes (Signed)
Low-risk OB appointment [id number] 5981w0d Estimated Date of Delivery: 05/02/15 BP 110/70 mmHg  Pulse 74  Wt 147 lb 6.4 oz (66.86 kg)  LMP 06/29/2014  BP, weight, and urine reviewed.  Refer to obstetrical flow sheet for FH & FHR.  Reports good fm.  Denies regular uc's, lof, vb, or uti s/s. Cold sx, feels like getting better.  FH still s<d, u/s 5/25 @ 34wks efw 50.8%, afi 16.4 Reviewed ptl s/s, fkc, cold relief measures- let us know if getting worse/not improving Plan:  Continue routine obstetrical care  F/U in 1wk for OB appointment and gbs

## 2015-04-04 NOTE — Addendum Note (Signed)
Addended by: Criss AlvinePULLIAM, CHRYSTAL G on: 04/04/2015 10:25 AM   Modules accepted: Orders

## 2015-04-04 NOTE — Patient Instructions (Signed)
Call the office ([phone]) or go to [facility] if:  You begin to have strong, frequent contractions  Your water breaks.  Sometimes it is a big gush of fluid, sometimes it is just a trickle that keeps getting your panties wet or running down your legs  You have vaginal bleeding.  It is normal to have a small amount of spotting if your cervix was checked.   You don't feel your baby moving like normal.  If you don't, get you something to eat and drink and lay down and focus on feeling your baby move.  You should feel at least 10 movements in 2 hours.  If you don't, you should call the office or go to [facility].    Preterm Labor Information Preterm labor is when labor starts at less than 37 weeks of pregnancy. The normal length of a pregnancy is 39 to 41 weeks. CAUSES Often, there is no identifiable underlying cause as to why a woman goes into preterm labor. One of the most common known causes of preterm labor is infection. Infections of the uterus, cervix, vagina, amniotic sac, bladder, kidney, or even the lungs (pneumonia) can cause labor to start. Other suspected causes of preterm labor include:   Urogenital infections, such as yeast infections and bacterial vaginosis.   Uterine abnormalities (uterine shape, uterine septum, fibroids, or bleeding from the placenta).   A cervix that has been operated on (it may fail to stay closed).   Malformations in the fetus.   Multiple gestations (twins, triplets, and so on).   Breakage of the amniotic sac.  RISK FACTORS  Having a previous history of preterm labor.   Having premature rupture of membranes (PROM).   Having a placenta that covers the opening of the cervix (placenta previa).   Having a placenta that separates from the uterus (placental abruption).   Having a cervix that is too weak to hold the fetus in the uterus (incompetent cervix).   Having too much fluid in the amniotic sac (polyhydramnios).   Taking  illegal drugs or smoking while pregnant.   Not gaining enough weight while pregnant.   Being younger than 18 and older than 26 years old.   Having a low socioeconomic status.   Being African American. SYMPTOMS Signs and symptoms of preterm labor include:   Menstrual-like cramps, abdominal pain, or back pain.  Uterine contractions that are regular, as frequent as six in an hour, regardless of their intensity (may be mild or painful).  Contractions that start on the top of the uterus and spread down to the lower abdomen and back.   A sense of increased pelvic pressure.   A watery or bloody mucus discharge that comes from the vagina.  TREATMENT Depending on the length of the pregnancy and other circumstances, your health care provider may suggest bed rest. If necessary, there are medicines that can be given to stop contractions and to mature the fetal lungs. If labor happens before 34 weeks of pregnancy, a prolonged hospital stay may be recommended. Treatment depends on the condition of both you and the fetus.  WHAT SHOULD YOU DO IF YOU THINK YOU ARE IN PRETERM LABOR? Call your health care provider right away. You will need to go to the hospital to get checked immediately. HOW CAN YOU PREVENT PRETERM LABOR IN FUTURE PREGNANCIES? You should:   Stop smoking if you smoke.  Maintain healthy weight gain and avoid chemicals and drugs that are not necessary.  Be watchful for   any type of infection.  Inform your health care provider if you have a known history of preterm labor. Document Released: 01/03/2004 Document Revised: 06/15/2013 Document Reviewed: 11/15/2012 ExitCare Patient Information 2015 Exit[employer], LLC. This information is not intended to replace advice given to you by your health care provider. Make sure you discuss any questions you have with your health care provider.  

## 2015-04-11 ENCOUNTER — Encounter: Payer: Medicaid Other | Admitting: Women's Health

## 2015-04-12 ENCOUNTER — Encounter: Payer: Medicaid Other | Admitting: Obstetrics & Gynecology

## 2015-04-17 ENCOUNTER — Ambulatory Visit (INDEPENDENT_AMBULATORY_CARE_PROVIDER_SITE_OTHER): Payer: Medicaid Other | Admitting: Obstetrics & Gynecology

## 2015-04-17 ENCOUNTER — Encounter: Payer: Self-pay | Admitting: Obstetrics & Gynecology

## 2015-04-17 VITALS — BP 120/80 | HR 84 | Wt 145.0 lb

## 2015-04-17 DIAGNOSIS — Z369 Encounter for antenatal screening, unspecified: Secondary | ICD-10-CM

## 2015-04-17 DIAGNOSIS — Z3493 Encounter for supervision of normal pregnancy, unspecified, third trimester: Secondary | ICD-10-CM

## 2015-04-17 DIAGNOSIS — Z331 Pregnant state, incidental: Secondary | ICD-10-CM

## 2015-04-17 DIAGNOSIS — Z1389 Encounter for screening for other disorder: Secondary | ICD-10-CM

## 2015-04-17 LAB — POCT URINALYSIS DIPSTICK
Blood, UA: NEGATIVE
GLUCOSE UA: NEGATIVE
Ketones, UA: NEGATIVE
LEUKOCYTES UA: NEGATIVE
Nitrite, UA: NEGATIVE
Protein, UA: NEGATIVE

## 2015-04-17 NOTE — Progress Notes (Signed)
[id number] [redacted]w[redacted]d Estimated Date of Delivery: 05/02/15  Blood pressure 120/80, pulse 84, weight 145 lb (65.772 kg), last menstrual period 06/29/2014.   BP weight and urine results all reviewed and noted.  Please refer to the obstetrical flow sheet for the fundal height and fetal heart rate documentation:  Patient reports good fetal movement, denies any bleeding and no rupture of membranes symptoms or regular contractions. Patient is without complaints. All questions were answered.  Plan:  Continued routine obstetrical care,   Follow up in 1 weeks for OB appointment,

## 2015-04-17 NOTE — Addendum Note (Signed)
Addended by: Richardson Chiquito on: 04/17/2015 02:15 PM   Modules accepted: Orders

## 2015-04-17 NOTE — Addendum Note (Signed)
Addended by: Richardson Chiquito on: 04/17/2015 01:58 PM   Modules accepted: Orders

## 2015-04-18 LAB — GC/CHLAMYDIA PROBE AMP
CHLAMYDIA, DNA PROBE: NEGATIVE
NEISSERIA GONORRHOEAE BY PCR: NEGATIVE

## 2015-04-18 LAB — OB RESULTS CONSOLE GBS: GBS: POSITIVE

## 2015-04-22 LAB — STREP GP B NAA+RFLX: Strep Gp B NAA+Rflx: POSITIVE — AB

## 2015-04-22 LAB — STREP GP B SUSCEPTIBILITY

## 2015-04-24 ENCOUNTER — Encounter: Payer: Medicaid Other | Admitting: Women's Health

## 2015-04-25 ENCOUNTER — Ambulatory Visit (INDEPENDENT_AMBULATORY_CARE_PROVIDER_SITE_OTHER): Payer: Medicaid Other | Admitting: Women's Health

## 2015-04-25 ENCOUNTER — Encounter: Payer: Self-pay | Admitting: Women's Health

## 2015-04-25 VITALS — BP 112/64 | HR 92 | Wt 148.0 lb

## 2015-04-25 DIAGNOSIS — Z3A39 39 weeks gestation of pregnancy: Secondary | ICD-10-CM | POA: Diagnosis not present

## 2015-04-25 DIAGNOSIS — Z331 Pregnant state, incidental: Secondary | ICD-10-CM

## 2015-04-25 DIAGNOSIS — Z1389 Encounter for screening for other disorder: Secondary | ICD-10-CM

## 2015-04-25 DIAGNOSIS — O283 Abnormal ultrasonic finding on antenatal screening of mother: Secondary | ICD-10-CM

## 2015-04-25 DIAGNOSIS — O26843 Uterine size-date discrepancy, third trimester: Secondary | ICD-10-CM

## 2015-04-25 DIAGNOSIS — Z3493 Encounter for supervision of normal pregnancy, unspecified, third trimester: Secondary | ICD-10-CM

## 2015-04-25 LAB — POCT URINALYSIS DIPSTICK
Blood, UA: NEGATIVE
GLUCOSE UA: NEGATIVE
Ketones, UA: NEGATIVE
LEUKOCYTES UA: NEGATIVE
Nitrite, UA: NEGATIVE

## 2015-04-25 NOTE — Progress Notes (Signed)
Low-risk OB appointment [id number] 7946w0d Estimated Date of Delivery: 05/02/15 BP 112/64 mmHg  Pulse 92  Wt 148 lb (67.132 kg)  LMP 06/29/2014  BP, weight, and urine reviewed.  Refer to obstetrical flow sheet for FH & FHR.  Reports good fm.  Denies regular uc's, lof, vb, or uti s/s. No complaints. SVE per request: 2/th/ballotable, vtx Reviewed labor s/s, fkc. Plan:  Continue routine obstetrical care  F/U asap for efw/afi u/s for s<d, then 1wk for OB appointment

## 2015-04-25 NOTE — Patient Instructions (Signed)
Call the office ([phone]) or go to [facility] if:  You begin to have strong, frequent contractions  Your water breaks.  Sometimes it is a big gush of fluid, sometimes it is just a trickle that keeps getting your panties wet or running down your legs  You have vaginal bleeding.  It is normal to have a small amount of spotting if your cervix was checked.   You don't feel your baby moving like normal.  If you don't, get you something to eat and drink and lay down and focus on feeling your baby move.  You should feel at least 10 movements in 2 hours.  If you don't, you should call the office or go to [facility].    Braxton Hicks Contractions Contractions of the uterus can occur throughout pregnancy. Contractions are not always a sign that you are in labor.  WHAT ARE BRAXTON HICKS CONTRACTIONS?  Contractions that occur before labor are called Braxton Hicks contractions, or false labor. Toward the end of pregnancy (32-34 weeks), these contractions can develop more often and may become more forceful. This is not true labor because these contractions do not result in opening (dilatation) and thinning of the cervix. They are sometimes difficult to tell apart from true labor because these contractions can be forceful and people have different pain tolerances. You should not feel embarrassed if you go to the hospital with false labor. Sometimes, the only way to tell if you are in true labor is for your health care provider to look for changes in the cervix. If there are no prenatal problems or other health problems associated with the pregnancy, it is completely safe to be sent home with false labor and await the onset of true labor. HOW CAN YOU TELL THE DIFFERENCE BETWEEN TRUE AND FALSE LABOR? False Labor  The contractions of false labor are usually shorter and not as hard as those of true labor.   The contractions are usually irregular.   The contractions are often felt in the front of  the lower abdomen and in the groin.   The contractions may go away when you walk around or change positions while lying down.   The contractions get weaker and are shorter lasting as time goes on.   The contractions do not usually become progressively stronger, regular, and closer together as with true labor.  True Labor  Contractions in true labor last 30-70 seconds, become very regular, usually become more intense, and increase in frequency.   The contractions do not go away with walking.   The discomfort is usually felt in the top of the uterus and spreads to the lower abdomen and low back.   True labor can be determined by your health care provider with an exam. This will show that the cervix is dilating and getting thinner.  WHAT TO REMEMBER  Keep up with your usual exercises and follow other instructions given by your health care provider.   Take medicines as directed by your health care provider.   Keep your regular prenatal appointments.   Eat and drink lightly if you think you are going into labor.   If Braxton Hicks contractions are making you uncomfortable:   Change your position from lying down or resting to walking, or from walking to resting.   Sit and rest in a tub of warm water.   Drink 2-3 glasses of water. Dehydration may cause these contractions.   Do slow and deep breathing several times an hour.    WHEN SHOULD I SEEK IMMEDIATE MEDICAL CARE? Seek immediate medical care if:  Your contractions become stronger, more regular, and closer together.   You have fluid leaking or gushing from your vagina.   You have a fever.   You pass blood-tinged mucus.   You have vaginal bleeding.   You have continuous abdominal pain.   You have low back pain that you never had before.   You feel your baby's head pushing down and causing pelvic pressure.   Your baby is not moving as much as it used to.  Document Released: 10/13/2005 Document  Revised: 10/18/2013 Document Reviewed: 07/25/2013 ExitCare Patient Information 2015 ExitCare, LLC. This information is not intended to replace advice given to you by your health care provider. Make sure you discuss any questions you have with your health care provider.  

## 2015-04-27 ENCOUNTER — Ambulatory Visit (INDEPENDENT_AMBULATORY_CARE_PROVIDER_SITE_OTHER): Payer: Medicaid Other

## 2015-04-27 DIAGNOSIS — O26843 Uterine size-date discrepancy, third trimester: Secondary | ICD-10-CM

## 2015-04-27 DIAGNOSIS — O283 Abnormal ultrasonic finding on antenatal screening of mother: Secondary | ICD-10-CM | POA: Diagnosis not present

## 2015-04-27 DIAGNOSIS — O0930 Supervision of pregnancy with insufficient antenatal care, unspecified trimester: Secondary | ICD-10-CM

## 2015-04-27 DIAGNOSIS — Z3493 Encounter for supervision of normal pregnancy, unspecified, third trimester: Secondary | ICD-10-CM

## 2015-04-27 NOTE — Progress Notes (Signed)
[place] 39+2wks,efw 3714g,cephalic,LV EICF n/c,afi 16.5cm,normal ov's bilat,fht 122bpm,fundal pl gr 3

## 2015-04-29 ENCOUNTER — Inpatient Hospital Stay: Payer: Medicaid Other | Admitting: Anesthesiology

## 2015-04-29 ENCOUNTER — Inpatient Hospital Stay
Admission: EM | Admit: 2015-04-29 | Discharge: 2015-05-02 | DRG: 767 | Disposition: A | Discharge status: Home / Self Care | Payer: Medicaid Other | Attending: Obstetrics and Gynecology | Admitting: Obstetrics and Gynecology

## 2015-04-29 ENCOUNTER — Encounter: Payer: Self-pay | Admitting: *Deleted

## 2015-04-29 DIAGNOSIS — O99824 Streptococcus B carrier state complicating childbirth: Secondary | ICD-10-CM | POA: Diagnosis present

## 2015-04-29 DIAGNOSIS — F1290 Cannabis use, unspecified, uncomplicated: Secondary | ICD-10-CM | POA: Diagnosis present

## 2015-04-29 DIAGNOSIS — O99820 Streptococcus B carrier state complicating pregnancy: Secondary | ICD-10-CM | POA: Diagnosis present

## 2015-04-29 DIAGNOSIS — O99324 Drug use complicating childbirth: Secondary | ICD-10-CM | POA: Diagnosis present

## 2015-04-29 DIAGNOSIS — Z302 Encounter for sterilization: Secondary | ICD-10-CM | POA: Diagnosis not present

## 2015-04-29 DIAGNOSIS — O4292 Full-term premature rupture of membranes, unspecified as to length of time between rupture and onset of labor: Secondary | ICD-10-CM | POA: Diagnosis present

## 2015-04-29 DIAGNOSIS — Z87891 Personal history of nicotine dependence: Secondary | ICD-10-CM

## 2015-04-29 DIAGNOSIS — O0933 Supervision of pregnancy with insufficient antenatal care, third trimester: Secondary | ICD-10-CM | POA: Diagnosis not present

## 2015-04-29 DIAGNOSIS — O0930 Supervision of pregnancy with insufficient antenatal care, unspecified trimester: Secondary | ICD-10-CM

## 2015-04-29 DIAGNOSIS — Z3493 Encounter for supervision of normal pregnancy, unspecified, third trimester: Secondary | ICD-10-CM

## 2015-04-29 DIAGNOSIS — Z3A39 39 weeks gestation of pregnancy: Secondary | ICD-10-CM | POA: Diagnosis present

## 2015-04-29 LAB — URINE DRUG SCREEN, QUALITATIVE (ARMC ONLY)
Amphetamines, Ur Screen: NOT DETECTED
Barbiturates, Ur Screen: NOT DETECTED
Benzodiazepine, Ur Scrn: NOT DETECTED
CANNABINOID 50 NG, UR ~~LOC~~: POSITIVE — AB
Cocaine Metabolite,Ur ~~LOC~~: NOT DETECTED
MDMA (Ecstasy)Ur Screen: NOT DETECTED
METHADONE SCREEN, URINE: NOT DETECTED
Opiate, Ur Screen: NOT DETECTED
Phencyclidine (PCP) Ur S: NOT DETECTED
Tricyclic, Ur Screen: NOT DETECTED

## 2015-04-29 LAB — CBC
HCT: 34.3 % — ABNORMAL LOW (ref 35.0–47.0)
HEMOGLOBIN: 11.1 g/dL — AB (ref 12.0–16.0)
MCH: 27.1 pg (ref 26.0–34.0)
MCHC: 32.2 g/dL (ref 32.0–36.0)
MCV: 84.1 fL (ref 80.0–100.0)
Platelets: 198 10*3/uL (ref 150–440)
RBC: 4.08 MIL/uL (ref 3.80–5.20)
RDW: 16 % — ABNORMAL HIGH (ref 11.5–14.5)
WBC: 9.2 10*3/uL (ref 3.6–11.0)

## 2015-04-29 LAB — CHLAMYDIA/NGC RT PCR (ARMC ONLY)
CHLAMYDIA TR: NOT DETECTED
N gonorrhoeae: NOT DETECTED

## 2015-04-29 LAB — SAMPLE TO BLOOD BANK

## 2015-04-29 MED ORDER — AMMONIA AROMATIC IN INHA
RESPIRATORY_TRACT | Status: AC
  Filled 2015-04-29: qty 10

## 2015-04-29 MED ORDER — SODIUM CHLORIDE 0.9 % IV SOLN
INTRAVENOUS | Status: AC
  Administered 2015-04-29: 2 g via INTRAVENOUS
  Filled 2015-04-29: qty 2000

## 2015-04-29 MED ORDER — LACTATED RINGERS IV SOLN
INTRAVENOUS | Status: DC
  Administered 2015-04-29: 04:00:00 via INTRAVENOUS
  Administered 2015-04-29: 125 mL/h via INTRAVENOUS

## 2015-04-29 MED ORDER — BUTORPHANOL TARTRATE 1 MG/ML IJ SOLN
2.0000 mg | Freq: Once | INTRAMUSCULAR | Status: AC
  Administered 2015-04-29: 2 mg via INTRAVENOUS

## 2015-04-29 MED ORDER — IBUPROFEN 600 MG PO TABS
ORAL_TABLET | ORAL | Status: AC
  Administered 2015-04-29: 600 mg via ORAL
  Filled 2015-04-29: qty 1

## 2015-04-29 MED ORDER — DIBUCAINE 1 % RE OINT: 1.0000 "application " | TOPICAL_OINTMENT | RECTAL | Status: DC | PRN

## 2015-04-29 MED ORDER — DIPHENHYDRAMINE HCL 25 MG PO CAPS: 25.0000 mg | ORAL_CAPSULE | Freq: Four times a day (QID) | ORAL | Status: DC | PRN

## 2015-04-29 MED ORDER — IBUPROFEN 600 MG PO TABS
600.0000 mg | ORAL_TABLET | Freq: Four times a day (QID) | ORAL | Status: DC
  Administered 2015-04-29 – 2015-05-01 (×6): 600 mg via ORAL
  Filled 2015-04-29 (×5): qty 1

## 2015-04-29 MED ORDER — SIMETHICONE 80 MG PO CHEW: 80.0000 mg | CHEWABLE_TABLET | ORAL | Status: DC | PRN

## 2015-04-29 MED ORDER — SODIUM CHLORIDE 0.9 % IV SOLN
1.0000 g | INTRAVENOUS | Status: DC
  Filled 2015-04-29 (×6): qty 1000

## 2015-04-29 MED ORDER — OXYTOCIN 10 UNIT/ML IJ SOLN: 10.0000 [IU] | Freq: Once | INTRAMUSCULAR | Status: DC | PRN

## 2015-04-29 MED ORDER — ACETAMINOPHEN 325 MG PO TABS: 650.0000 mg | ORAL_TABLET | ORAL | Status: DC | PRN

## 2015-04-29 MED ORDER — BENZOCAINE-MENTHOL 20-0.5 % EX AERO: 1.0000 "application " | INHALATION_SPRAY | CUTANEOUS | Status: DC | PRN

## 2015-04-29 MED ORDER — WITCH HAZEL-GLYCERIN EX PADS: 1.0000 "application " | MEDICATED_PAD | CUTANEOUS | Status: DC | PRN

## 2015-04-29 MED ORDER — ONDANSETRON HCL 4 MG/2ML IJ SOLN
4.0000 mg | INTRAMUSCULAR | Status: DC | PRN
  Administered 2015-05-01: 4 mg via INTRAVENOUS

## 2015-04-29 MED ORDER — PRENATAL MULTIVITAMIN CH
1.0000 | ORAL_TABLET | Freq: Every day | ORAL | Status: DC
  Administered 2015-04-29 – 2015-05-02 (×2): 1 via ORAL
  Filled 2015-04-29 (×2): qty 1

## 2015-04-29 MED ORDER — OXYTOCIN 40 UNITS IN LACTATED RINGERS INFUSION - SIMPLE MED
INTRAVENOUS | Status: AC
  Administered 2015-04-29: 40 [IU]
  Filled 2015-04-29: qty 1000

## 2015-04-29 MED ORDER — LIDOCAINE HCL (PF) 1 % IJ SOLN
INTRAMUSCULAR | Status: AC
  Filled 2015-04-29: qty 30

## 2015-04-29 MED ORDER — LANOLIN HYDROUS EX OINT: TOPICAL_OINTMENT | CUTANEOUS | Status: DC | PRN

## 2015-04-29 MED ORDER — OXYCODONE-ACETAMINOPHEN 5-325 MG PO TABS: 1.0000 | ORAL_TABLET | ORAL | Status: DC | PRN

## 2015-04-29 MED ORDER — BUTORPHANOL TARTRATE 1 MG/ML IJ SOLN
INTRAMUSCULAR | Status: AC
  Administered 2015-04-29: 2 mg via INTRAVENOUS
  Filled 2015-04-29: qty 2

## 2015-04-29 MED ORDER — SODIUM CHLORIDE 0.9 % IV SOLN
2.0000 g | Freq: Once | INTRAVENOUS | Status: AC
  Administered 2015-04-29: 2 g via INTRAVENOUS

## 2015-04-29 MED ORDER — MISOPROSTOL 200 MCG PO TABS
ORAL_TABLET | ORAL | Status: AC
  Filled 2015-04-29: qty 4

## 2015-04-29 MED ORDER — FERROUS SULFATE 325 (65 FE) MG PO TABS
325.0000 mg | ORAL_TABLET | Freq: Every day | ORAL | Status: DC
  Administered 2015-04-30 – 2015-05-02 (×2): 325 mg via ORAL
  Filled 2015-04-29 (×2): qty 1

## 2015-04-29 MED ORDER — LIDOCAINE-EPINEPHRINE (PF) 1.5 %-1:200000 IJ SOLN
INTRAMUSCULAR | Status: DC | PRN
  Administered 2015-04-29: 3 mL via PERINEURAL
  Administered 2015-04-29: 4 mL via PERINEURAL

## 2015-04-29 MED ORDER — ACETAMINOPHEN 325 MG PO TABS
650.0000 mg | ORAL_TABLET | ORAL | Status: DC | PRN
  Administered 2015-05-02: 650 mg via ORAL
  Filled 2015-04-29: qty 2

## 2015-04-29 MED ORDER — LACTATED RINGERS IV SOLN: 500.0000 mL | INTRAVENOUS | Status: DC | PRN

## 2015-04-29 MED ORDER — ZOLPIDEM TARTRATE 5 MG PO TABS: 5.0000 mg | ORAL_TABLET | Freq: Every evening | ORAL | Status: DC | PRN

## 2015-04-29 MED ORDER — ONDANSETRON HCL 4 MG PO TABS: 4.0000 mg | ORAL_TABLET | ORAL | Status: DC | PRN

## 2015-04-29 MED ORDER — LACTATED RINGERS IV SOLN
INTRAVENOUS | Status: DC
  Administered 2015-05-01: 15:00:00 via INTRAVENOUS

## 2015-04-29 MED ORDER — FENTANYL 2.5 MCG/ML W/ROPIVACAINE 0.2% IN NS 100 ML EPIDURAL INFUSION (ARMC-ANES)
EPIDURAL | Status: AC
  Filled 2015-04-29: qty 100

## 2015-04-29 MED ORDER — OXYCODONE-ACETAMINOPHEN 5-325 MG PO TABS
2.0000 | ORAL_TABLET | ORAL | Status: DC | PRN
  Administered 2015-04-29 – 2015-05-01 (×4): 2 via ORAL
  Filled 2015-04-29 (×4): qty 2

## 2015-04-29 MED ORDER — OXYTOCIN 10 UNIT/ML IJ SOLN
INTRAMUSCULAR | Status: AC
  Filled 2015-04-29: qty 2

## 2015-04-29 MED ORDER — DOCUSATE SODIUM 100 MG PO CAPS
100.0000 mg | ORAL_CAPSULE | Freq: Two times a day (BID) | ORAL | Status: DC
  Administered 2015-04-30 – 2015-05-02 (×4): 100 mg via ORAL
  Filled 2015-04-29 (×4): qty 1

## 2015-04-29 MED ORDER — LIDOCAINE HCL (PF) 1 % IJ SOLN
30.0000 mL | INTRAMUSCULAR | Status: DC | PRN
  Filled 2015-04-29: qty 30

## 2015-04-29 MED ORDER — BUPIVACAINE HCL (PF) 0.25 % IJ SOLN
INTRAMUSCULAR | Status: DC | PRN
  Administered 2015-04-29: 8 mL

## 2015-04-29 NOTE — Anesthesia Procedure Notes (Signed)
Epidural Patient location during procedure: OB Start time: 04/29/2015 7:32 AM End time: 04/29/2015 7:43 AM  Staffing Anesthesiologist: Yves DillARROLL, Kierah Goatley Performed by: anesthesiologist   Preanesthetic Checklist Completed: patient identified, site marked, surgical consent, pre-op evaluation, timeout performed, IV checked, risks and benefits discussed and monitors and equipment checked  Epidural Patient position: sitting Prep: Betadine Patient monitoring: heart rate, cardiac monitor, continuous pulse ox and blood pressure Approach: midline Location: L4-L5 Injection technique: LOR air  Needle:  Needle type: Tuohy  Needle gauge: 18 G Needle length: 9 cm Catheter type: closed end flexible Catheter size: 20 Guage Test dose: negative  Assessment Sensory level: T8  Additional Notes Easy epidural under sterile prep and drape.  Catheter threaded 5cm into epidural space with only slight tinge of blood in catheter at end aspiration.  Test dose was done during contraction with neg RXN., but catheter pulled back anyway to 3cm and retested with neg test dose between contractions and again neg test dose.  Patient tolerated the procedure well.Reason for block:procedure for pain

## 2015-04-29 NOTE — H&P (Signed)
Obstetric History and Physical  Kristina Suarez is a 24 y.o. [id number] with Estimated Date of Delivery: 05/02/15 per LMP and 10 wk [place] who presents at [redacted]w[redacted]d  presenting for SROM at 02:56 this am. Patient states she has been having irregular contractions, with active fetal movement.    Prenatal Course Source of Care: [facility] in [place]  with onset of care at 10 weeks Pregnancy complications or risks: Patient Active Problem List   Diagnosis Date Noted  . Fetal cardiac echogenic focus, antepartum 02/27/2015  . Limited prenatal care, antepartum 02/07/2015  . Contraception management : [facility] BTL desired 12/25/2014  . 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 Supervision of normal pregnancy 11/06/2014   Growth [place] at 29 wks and 34 weeks normal. EFW 50% at 34 weeks, FH has been S<D by 4 cm  She plans to bottle feed She desires bilateral tubal ligation for postpartum contraception.   Prenatal labs and studies: ABO, Rh: AB +  Antibody: neg Rubella: Immune Varicella: Immune RPR:  NR HBsAg:  neg HIV: NR GC/CT: neg/neg GBS: positive 1 hr Glucola: 94   Genetic screening: negative 1st trimester    Prenatal Transfer Tool   Past Medical History  Diagnosis Date  . Vaginal Pap smear, abnormal   . Pregnant 10/04/2014    Past Surgical History  Procedure Laterality Date  . Cervical cone biopsy  2011    OB History  Gravida Para Term Preterm AB SAB TAB Ectopic Multiple Living  # Outcome Date GA Lbr Len/2nd Weight Sex Delivery Anes PTL Lv  3 Current           2 Term 08/07/13 [redacted]w[redacted]d  6 lb 11 oz (3.033 kg) M Vag-Spont  N   1 Term 06/23/09 [redacted]w[redacted]d  6 lb 12 oz (3.062 kg) M Vag-Spont  N       History   Social History  . Marital Status: Married    Spouse Name: N/A  . Number of Children: N/A  . Years of Education: N/A   Social History Main Topics  . Smoking status: Former Smoker    Types: Cigarettes  . Smokeless tobacco: Never Used  . Alcohol Use: No  . Drug Use: No  . Sexual  Activity: Yes    Birth Control/ Protection: None   Other Topics Concern  . None   Social History Narrative    Family History  Problem Relation Age of Onset  . Diabetes Maternal Grandmother   . Hypertension Maternal Grandmother   . Other Maternal Grandmother     heart problems  . HIV Mother   . ADD / ADHD Sister     Prescriptions prior to admission  Medication Sig Dispense Refill Last Dose  . amoxicillin (AMOXIL) 500 MG capsule   0 Not Taking  . Pediatric Multiple Vit-C-FA (FLINSTONES GUMMIES OMEGA-3 DHA) CHEW Chew by mouth.   Taking  . acetaminophen-codeine (TYLENOL #3) 300-30 MG per tablet Take by mouth every 4 (four) hours as needed for moderate pain.   Not Taking  . Doxylamine-Pyridoxine (DICLEGIS) 10-10 MG TBEC 2 tabs q hs, if sx persist add 1 tab q am on day 3, if sx persist add 1 tab q afternoon on day 4 (Patient not taking: Reported on 12/25/2014) 100 tablet 6 Not Taking  . Doxylamine-Pyridoxine 10-10 MG TBEC 2 PO qhs; may take 1po in am and 1po in afternoon prn nausea (Patient not taking: Reported on 12/25/2014) 120  tablet 3 Not Taking  . terconazole (TERAZOL 7) 0.4 % vaginal cream Place 1 applicator vaginally at bedtime. (Patient not taking: Reported on 03/07/2015) 45 g 0 Not Taking    No Known Allergies  Review of Systems: Negative except for what is mentioned in HPI.  Physical Exam: BP 118/76 mmHg  Pulse 84  Temp(Src) 98.4 F (36.9 C) (Oral)  Resp 20  Ht 5\' 8"  (1.727 m)  Wt 148 lb (67.132 kg)  BMI 22.51 kg/m2  LMP 06/29/2014 GENERAL: Well-developed, well-nourished female in no acute distress.  LUNGS: Clear to auscultation bilaterally.  HEART: Regular rate and rhythm. ABDOMEN: Soft, nontender, nondistended, gravid. EXTREMITIES: Nontender, no edema Cervical Exam: Dilatation 4 cm   Effacement 80 %   Station ballotable   Presentation: cephalic - confirmed with bedside US FHT: Category: 1 Baseline rate 120 bpm   Variability moderate, initially minimal variability   Accelerations present   Decelerations none Contractions: Every 5-6 mins   Pertinent Labs/Studies:   No results found for this or any previous visit (from the past 24 hour(s)).  Assessment : IUP at 918w4d, SROM, labor  Plan: Admit for labor, Antibiotics for GBS prophylaxis and Observe for cervical change  Epidural when labs available, declines IV pain medication for now UDS for h/o + marijuana screen during pregnancy  Pt desires BTL and has signed consent. Will consult with Dr Vergie LivingPickens after delivery.

## 2015-04-29 NOTE — Progress Notes (Signed)
[facility] [place] She had signed her Medicaid BTL consent with them on 12/25/14. I confirmed with our business office, d/t other provider's name on BTL consent she will need to f/u with them. Alternately pt could sign consent with [facility] and have interval BTL through us.

## 2015-04-29 NOTE — Anesthesia Preprocedure Evaluation (Signed)
Anesthesia Evaluation  Patient identified by MRN, date of birth, ID band Patient awake    Reviewed: Allergy & Precautions, NPO status , Patient's Chart, lab work & pertinent test results  Airway Mallampati: II  TM Distance: >3 FB Neck ROM: Full    Dental no notable dental hx.    Pulmonary former smoker,    Pulmonary exam normal       Cardiovascular negative cardio ROS Normal cardiovascular exam    Neuro/Psych negative neurological ROS     GI/Hepatic negative GI ROS, Neg liver ROS,   Endo/Other  negative endocrine ROS  Renal/GU negative Renal ROS  negative genitourinary   Musculoskeletal negative musculoskeletal ROS (+)   Abdominal Normal abdominal exam  (+)   Peds negative pediatric ROS (+)  Hematology negative hematology ROS (+)   Anesthesia Other Findings   Reproductive/Obstetrics (+) Pregnancy                             Anesthesia Physical Anesthesia Plan  ASA: II  Anesthesia Plan: Epidural   Post-op Pain Management:    Induction:   Airway Management Planned: Natural Airway  Additional Equipment:   Intra-op Plan:   Post-operative Plan:   Informed Consent: I have reviewed the patients History and Physical, chart, labs and discussed the procedure including the risks, benefits and alternatives for the proposed anesthesia with the patient or authorized representative who has indicated his/her understanding and acceptance.   Dental advisory given  Plan Discussed with: Surgeon  Anesthesia Plan Comments:         Anesthesia Quick Evaluation

## 2015-04-30 LAB — RPR: RPR: NONREACTIVE

## 2015-04-30 LAB — HEMOGLOBIN AND HEMATOCRIT, BLOOD
HEMATOCRIT: 33.8 % — AB (ref 35.0–47.0)
Hemoglobin: 11.2 g/dL — ABNORMAL LOW (ref 12.0–16.0)

## 2015-04-30 MED ORDER — SODIUM CHLORIDE 0.9 % IJ SOLN
3.0000 mL | Freq: Four times a day (QID) | INTRAMUSCULAR | Status: DC
  Administered 2015-04-30 – 2015-05-01 (×4): 3 mL via INTRAVENOUS

## 2015-04-30 NOTE — Anesthesia Postprocedure Evaluation (Signed)
  Anesthesia Post-op Note  Patient: Kristina Suarez  Procedure(s) Performed: epidural  Anesthesia type:Epidural  Patient location:333A  Post pain: Pain level controlled  Post assessment: Post-op Vital signs reviewed, Patient's Cardiovascular Status Stable, Respiratory Function Stable, Patent Airway and No signs of Nausea or vomiting  Post vital signs: Reviewed and stable  Last Vitals:  Filed Vitals:   04/30/15 0403  BP: 104/60  Pulse: 60  Temp: 36.6 C  Resp: 16    Level of consciousness: awake, alert  and patient cooperative  Complications: No apparent anesthesia complications

## 2015-04-30 NOTE — Clinical Social Work Note (Signed)
Clinical Social Work Assessment  Patient Details  Name: Kristina Suarez MRN: [id number] Date of Birth: 1990-12-10  Date of referral:                  Reason for consult:  Housing Concerns/Homelessness, Substance Use/ETOH Abuse, Other (Comment Required) (Positive drug screen for patient and baby)                Permission sought to share information with:  Other Permission granted to share information::  No  Name::        Agency::     Relationship::     Contact Information:     Housing/Transportation Living arrangements for the past 2 months:  Hotel/Motel, Single Family Home Source of Information:  Patient Patient Interpreter Needed:  None Criminal Activity/Legal Involvement Pertinent to Current Situation/Hospitalization:  No - Comment as needed Significant Relationships:  Spouse, Friend Lives with:  Friends, Spouse, Minor Children Do you feel safe going back to the place where you live?  Yes Need for family participation in patient care:  No (Coment)  Care giving concerns:  Patient lives with her husband and friend.    Social Worker assessment / plan:  Holiday representative (CSW) received a call from [employer] that patient and baby had a positive drug screen for marijuana. Baby's drug screen was completed via urine. CSW met with patient to address consult. Patient was sitting up in the bed holding her son. Baby is 1 day old. Mother reported that she and her husband Charlottie Peragine [phone]) moved from [place] to [facility]. Per patient they had a house in [place] and her husband got laid off from his job. Per patient she was working in [employer] and had to quit due to her pregnancy. Patient reported that her morning sickness became very bad. Patient reported that she and her husband have 2 other children ages 42 and 22. Patient reported that they were evicted from the house in [place] because of 2 months of behind rent payments. Patient, husband and 2 children are now living with a  friend [place] in [place]. Friend's address is [address] [place]. Patient reported that her friend Gaspar Garbe is currently helping her find employment in [place]. Per patient the family is planning to stay in [place].   CSW also discussed positive drug screen with patient. Per patient she used marijuana during her preganacy to help with morning sickness and as a appitite stimulant. Patient reported that she received her prenatal care at [facility] in [place]. Per patient she made her doctor's at [facility] aware that she was using marijuana. Patient reported that she used marijuana before her pregancy and the it is legal in [place]. Patient reported that she used it recreationally and not everyday. Patient denied using other drugs and reported that she did not drink alcohol during her pregnancy. Patient reported that she did drink occasionally before her pregnancy. CSW made patient aware that a Child Protective Services report will be made due to baby having a positive drug screen. Patient reacted appropriately. Patient reported that she never used marijuana while her other children were in the home or under her supervision. CSW provided emotional support and additional resources. Patient reported that she has enough food in the house and supplies for baby and other children. Per patient she has never been involved with CPS.   CSW made a Child Protective Services report in [place].     Employment status:  Temporary, Unemployed [employer]  information:  Self Pay (Medicaid Pending) ([place] ) PT Recommendations:  Not assessed at this time Information / Referral to community resources:  CPS (Comment Required: [place], Name & Number of worker spoken with), Other (Comment Required) ([place] [facility] )  Patient/Family's Response to care:  Patient is agreeable to going home.   Patient/Family's Understanding of and Emotional Response to  Diagnosis, Current Treatment, and Prognosis:  Patient was pleasant and answered questions appropriately. Patient thanked CSW for visit and provided resources.  Please reconsult if future social work needs arise. CSW signing off.   Blima Rich, LCSWA [phone]  Emotional Assessment Appearance:  Appears stated age Attitude/Demeanor/Rapport:    Affect (typically observed):  Accepting, Pleasant, Quiet Orientation:  Oriented to Self, Oriented to Place, Oriented to  Time, Oriented to Situation Alcohol / Substance use:  Illicit Drugs Psych involvement (Current and /or in the community):  No (Comment)  Discharge Needs  Concerns to be addressed:  Substance Abuse Concerns Readmission within the last 30 days:  No Current discharge risk:  Substance Abuse Barriers to Discharge:  Continued Medical Work up   Elwyn Reach 04/30/2015, 3:34 PM

## 2015-04-30 NOTE — Progress Notes (Signed)
Post partum update:  We will be performing the post partum tubal.  There are too many emergency cases being done today, so will add on to schedule at 1pm (ish) for Dr. Jean RosenthalJackson tomorrow.   Patient will be made NPO after midnight.  Ranae Plumberhelsea Kailani Brass, MD

## 2015-04-30 NOTE — Progress Notes (Signed)
Post Partum Day 1 Subjective: Doing well, ambulating, tolerating regular PO diet, voiding spontaneously, tolerating pain with PO meds.  NO CP, SOB, Fever/chills, nausea/vomiting, or calf pain.  Objective: Blood pressure 123/75, pulse 60, temperature 98.1 F (36.7 C), temperature source Oral, resp. rate 18, SpO2 100 %, .  Physical Exam:  General: NAD, resting comfortably CV: RRR Pulm, nl effort, ctabl Abd: s/nd/nt Lochia: moderate Uterine Fundus: firm and below umbilicus DVT Evaluation: no evidence of DVT, LE nt bilaterally.   Recent Labs  04/29/15 0355 04/30/15 0440  HGB 11.1* 11.2*  HCT 34.3* 33.8*  WBC 9.2  --   PLT 198  --     Assessment/Plan: 24yo G3P3003 s/p SVD ppd #1 1. Continue routine post partum care 2. Anticipate discharge tomorrow 3. Social: patient had long discussion with me regarding her social situation.  Having a strained relationship with her husband, was evicted from her housing in [place] and family [place] been living [place].  A friend of her husband's took the family in, and they are currently living there, in [place].  They are from [place] and do not have family nearby.  She has limited resources for herself and her kids, and does not have a crib. Social work seeing her this afternoon.  Please use hospital resources to help her. 4. Request for tubal: apparently we cannot perform this service inpatient due to location of consent signing.  I will follow up on this, and if we can arrange for it, perform the tubal during this stay.  If not, we can re-sign consents and perform this as an outpatient 4 weeks post partum.  I have had an extensive conversation with her regarding possible scenarios for which she may have the desire to have more children, as well as alternatives to permanent sterilization with a LARC.  She has also been counseled about this at her previous doctor's office.  She has thought through her decision and is quite rational in her  defense of it.  She conceived this pregnancy on contraception and is weary of a LARC's effectiveness.      LOS: 1 day   Ward, Chelsea C 04/30/2015, 10:41 AM

## 2015-04-30 NOTE — Progress Notes (Signed)
Clinical Child psychotherapistocial Worker (CSW) received call from [employer] stating that patient was inquiring about vouchers for clothes and baby supplies. CSW gave patient contact information for [facility]. [facility] has baby supplies that can be donated to mother's in needs. Please reconsult if future social work needs arise. CSW signing off.   Jetta LoutBailey Morgan, LCSWA [phone]

## 2015-05-01 ENCOUNTER — Inpatient Hospital Stay: Payer: Medicaid Other | Admitting: Registered Nurse

## 2015-05-01 ENCOUNTER — Encounter
Admission: EM | Disposition: A | Discharge status: Home / Self Care | Payer: Self-pay | Source: Home / Self Care | Attending: Obstetrics and Gynecology

## 2015-05-01 HISTORY — PX: TUBAL LIGATION: SHX77

## 2015-05-01 SURGERY — LIGATION, FALLOPIAN TUBE, BILATERAL
Anesthesia: General | Laterality: Bilateral | Wound class: Clean Contaminated

## 2015-05-01 MED ORDER — FENTANYL CITRATE (PF) 100 MCG/2ML IJ SOLN
INTRAMUSCULAR | Status: AC
  Administered 2015-05-01: 25 ug via INTRAVENOUS
  Filled 2015-05-01: qty 2

## 2015-05-01 MED ORDER — ROCURONIUM BROMIDE 100 MG/10ML IV SOLN
INTRAVENOUS | Status: DC | PRN
  Administered 2015-05-01: 20 mg via INTRAVENOUS

## 2015-05-01 MED ORDER — FENTANYL CITRATE (PF) 100 MCG/2ML IJ SOLN
INTRAMUSCULAR | Status: DC | PRN
  Administered 2015-05-01 (×2): 50 ug via INTRAVENOUS
  Administered 2015-05-01: 100 ug via INTRAVENOUS

## 2015-05-01 MED ORDER — PROPOFOL 10 MG/ML IV BOLUS
INTRAVENOUS | Status: DC | PRN
  Administered 2015-05-01: 180 mg via INTRAVENOUS

## 2015-05-01 MED ORDER — HYDROMORPHONE HCL 1 MG/ML IJ SOLN
INTRAMUSCULAR | Status: AC
  Administered 2015-05-01: 0.25 mg via INTRAVENOUS
  Filled 2015-05-01: qty 1

## 2015-05-01 MED ORDER — BUPIVACAINE-EPINEPHRINE (PF) 0.25% -1:200000 IJ SOLN
INTRAMUSCULAR | Status: AC
  Filled 2015-05-01: qty 30

## 2015-05-01 MED ORDER — FENTANYL CITRATE (PF) 100 MCG/2ML IJ SOLN
25.0000 ug | INTRAMUSCULAR | Status: DC | PRN
  Administered 2015-05-01: 25 ug via INTRAVENOUS

## 2015-05-01 MED ORDER — OXYCODONE HCL 5 MG/5ML PO SOLN: 5.0000 mg | Freq: Once | ORAL | Status: DC | PRN

## 2015-05-01 MED ORDER — NEOSTIGMINE METHYLSULFATE 10 MG/10ML IV SOLN
INTRAVENOUS | Status: DC | PRN
  Administered 2015-05-01: 3 mg via INTRAVENOUS

## 2015-05-01 MED ORDER — MIDAZOLAM HCL 2 MG/2ML IJ SOLN
INTRAMUSCULAR | Status: DC | PRN
  Administered 2015-05-01: 2 mg via INTRAVENOUS

## 2015-05-01 MED ORDER — LIDOCAINE HCL (CARDIAC) 20 MG/ML IV SOLN
INTRAVENOUS | Status: DC | PRN
  Administered 2015-05-01: 50 mg via INTRAVENOUS

## 2015-05-01 MED ORDER — GLYCOPYRROLATE 0.2 MG/ML IJ SOLN
INTRAMUSCULAR | Status: DC | PRN
  Administered 2015-05-01: 0.6 mg via INTRAVENOUS

## 2015-05-01 MED ORDER — FENTANYL CITRATE (PF) 100 MCG/2ML IJ SOLN
25.0000 ug | INTRAMUSCULAR | Status: AC | PRN
  Administered 2015-05-01 (×4): 25 ug via INTRAVENOUS
  Administered 2015-05-01: 50 ug via INTRAVENOUS
  Administered 2015-05-01: 25 ug via INTRAVENOUS

## 2015-05-01 MED ORDER — SUCCINYLCHOLINE CHLORIDE 20 MG/ML IJ SOLN
INTRAMUSCULAR | Status: DC | PRN
  Administered 2015-05-01: 100 mg via INTRAVENOUS

## 2015-05-01 MED ORDER — BUPIVACAINE-EPINEPHRINE 0.25% -1:200000 IJ SOLN
INTRAMUSCULAR | Status: DC | PRN
  Administered 2015-05-01: 10 mL

## 2015-05-01 MED ORDER — HYDROMORPHONE HCL 1 MG/ML IJ SOLN
0.2500 mg | INTRAMUSCULAR | Status: DC | PRN
  Administered 2015-05-01: 0.5 mg via INTRAVENOUS
  Administered 2015-05-01 (×2): 0.25 mg via INTRAVENOUS

## 2015-05-01 MED ORDER — OXYCODONE HCL 5 MG PO TABS: 5.0000 mg | ORAL_TABLET | Freq: Once | ORAL | Status: DC | PRN

## 2015-05-01 MED ORDER — DEXAMETHASONE SODIUM PHOSPHATE 4 MG/ML IJ SOLN
INTRAMUSCULAR | Status: DC | PRN
  Administered 2015-05-01: 5 mg via INTRAVENOUS

## 2015-05-01 SURGICAL SUPPLY — 22 items
BLADE SURG 11 STRL SS SAFETY (MISCELLANEOUS) ×3 IMPLANT
CHLORAPREP W/TINT 26ML (MISCELLANEOUS) ×3 IMPLANT
DRAPE LAPAROTOMY 100X77 ABD (DRAPES) ×3 IMPLANT
ELECT CAUTERY BLADE 6.4 (BLADE) ×3 IMPLANT
GLOVE BIO SURGEON STRL SZ7 (GLOVE) ×6 IMPLANT
GLOVE BIOGEL PI IND STRL 7.5 (GLOVE) ×2 IMPLANT
GLOVE BIOGEL PI INDICATOR 7.5 (GLOVE) ×4
GOWN STRL REUS W/ TWL LRG LVL3 (GOWN DISPOSABLE) ×2 IMPLANT
GOWN STRL REUS W/ TWL XL LVL3 (GOWN DISPOSABLE) ×1 IMPLANT
GOWN STRL REUS W/TWL LRG LVL3 (GOWN DISPOSABLE) ×4
GOWN STRL REUS W/TWL XL LVL3 (GOWN DISPOSABLE) ×2
KIT RM TURNOVER CYSTO AR (KITS) ×3 IMPLANT
LABEL OR SOLS (LABEL) ×3 IMPLANT
LIQUID BAND (GAUZE/BANDAGES/DRESSINGS) ×3 IMPLANT
NDL SAFETY 22GX1.5 (NEEDLE) ×3 IMPLANT
NS IRRIG 500ML POUR BTL (IV SOLUTION) ×3 IMPLANT
PACK BASIN MINOR ARMC (MISCELLANEOUS) ×3 IMPLANT
PAD GROUND ADULT SPLIT (MISCELLANEOUS) ×3 IMPLANT
SUT PLAIN GUT 0 (SUTURE) ×6 IMPLANT
SUT VIC AB 3-0 PS2 18 (SUTURE) ×3 IMPLANT
SUT VICRYL 0 AB UR-6 (SUTURE) ×3 IMPLANT
SYRINGE 10CC LL (SYRINGE) ×3 IMPLANT

## 2015-05-01 NOTE — Anesthesia Preprocedure Evaluation (Addendum)
Anesthesia Evaluation  Patient identified by MRN, date of birth, ID band  Reviewed: Allergy & Precautions, H&P , NPO status , Patient's Chart, lab work & pertinent test results  History of Anesthesia Complications Negative for: history of anesthetic complications  Airway Mallampati: II  TM Distance: >3 FB Neck ROM: full    Dental  (+) Teeth Intact   Pulmonary former smoker,  breath sounds clear to auscultation        Cardiovascular negative cardio ROS Normal cardiovascular examRhythm:regular Rate:Normal     Neuro/Psych negative neurological ROS     GI/Hepatic negative GI ROS, Neg liver ROS,   Endo/Other  negative endocrine ROS  Renal/GU negative Renal ROS  negative genitourinary   Musculoskeletal   Abdominal   Peds  Hematology negative hematology ROS (+)   Anesthesia Other Findings Post partum day 2  Reproductive/Obstetrics negative OB ROS                            Anesthesia Physical Anesthesia Plan  ASA: II  Anesthesia Plan: General ETT   Post-op Pain Management:    Induction:   Airway Management Planned:   Additional Equipment:   Intra-op Plan:   Post-operative Plan:   Informed Consent: I have reviewed the patients History and Physical, chart, labs and discussed the procedure including the risks, benefits and alternatives for the proposed anesthesia with the patient or authorized representative who has indicated his/her understanding and acceptance.   Dental Advisory Given  Plan Discussed with: Anesthesiologist, CRNA and Surgeon  Anesthesia Plan Comments:         Anesthesia Quick Evaluation                                  Anesthesia Evaluation  Patient identified by MRN, date of birth, ID band Patient awake    Reviewed: Allergy & Precautions, NPO status , Patient's Chart, lab work & pertinent test results  Airway Mallampati: II  TM Distance: >3 FB Neck  ROM: Full    Dental no notable dental hx.    Pulmonary former smoker,    Pulmonary exam normal       Cardiovascular negative cardio ROS Normal cardiovascular exam    Neuro/Psych negative neurological ROS     GI/Hepatic negative GI ROS, Neg liver ROS,   Endo/Other  negative endocrine ROS  Renal/GU negative Renal ROS  negative genitourinary   Musculoskeletal negative musculoskeletal ROS (+)   Abdominal Normal abdominal exam  (+)   Peds negative pediatric ROS (+)  Hematology negative hematology ROS (+)   Anesthesia Other Findings   Reproductive/Obstetrics (+) Pregnancy                             Anesthesia Physical Anesthesia Plan  ASA: II  Anesthesia Plan: Epidural   Post-op Pain Management:    Induction:   Airway Management Planned: Natural Airway  Additional Equipment:   Intra-op Plan:   Post-operative Plan:   Informed Consent: I have reviewed the patients History and Physical, chart, labs and discussed the procedure including the risks, benefits and alternatives for the proposed anesthesia with the patient or authorized representative who has indicated his/her understanding and acceptance.   Dental advisory given  Plan Discussed with: Surgeon  Anesthesia Plan Comments:         Anesthesia Quick Evaluation

## 2015-05-01 NOTE — Transfer of Care (Signed)
Immediate Anesthesia Transfer of Care Note  Patient: Kristina Suarez  Procedure(s) Performed: Procedure(s): POST PARTUM BILATERAL TUBAL LIGATION (Bilateral)  Patient Location: PACU  Anesthesia Type:General  Level of Consciousness: awake, alert  and oriented  Airway & Oxygen Therapy: Patient Spontanous Breathing and Patient connected to face mask oxygen  Post-op Assessment: Post -op Vital signs reviewed and stable  Post vital signs: stable  Last Vitals:  Filed Vitals:   05/01/15 1519  BP: 103/52  Pulse: 60  Temp: 36.9 C  Resp: 9    Complications: No apparent anesthesia complications

## 2015-05-01 NOTE — Op Note (Signed)
Operative Report  Pre-Op Diagnosis: multiparity, desires permanent sterility  Post-Op Diagnosis: multiparity, desires permanent sterility  Procedures: Postpartum bilateral tubal ligation using Pomeroy method  Primary Surgeon: Dr. Thomasene MohairStephen Jackson   EBL: 5 ml   IVF: 500 mL   Specimens: portion of right and left fallopian tubes  Drains: none  Complications: None   Disposition: PACU   Condition: Stable   Findings: Normal-appearing fallopian tubes bilaterally  Procedure Summary:   The patient was taken to the operating room where general anesthesia is administered and found to be adequate. After timeout was called a small transverse, infraumbilical incision was made with the scalpel. The incision was carried down through the fascia until the peritoneum was identified and entered. The peritoneum was noted to be free of any adhesions and the incision was then extended.  The patient's right fallopian tube was identified, brought incision, and grasped with a Babcock clamp. The tube was then followed out to the fimbria. The Babcock clamp was then used to grasp the tube approximately 4 cm from the cornual region. A 3 cm segment of tube was then ligated with the 2 free ties of plain gut, and excised. Good hemostasis was noted and the tube was returned to the abdomen. The left fallopian tube was then ligated, and a 3 cm segment excised in a similar fashion. Excellent hemostasis was noted, and the tube returned to the abdomen.  The peritoneum and fascia were closed in a single layer using 3-0 Vicryl. The skin was closed in a subcuticular fashion using 3-0 vicryl, undyed. The closure was also closed with Dermabond.  The patient tolerated the procedure well. Sponge, lap, and needle counts were correct 2. The patient was taken to the recovery room in stable condition.   Conard NovakStephen D. Jackson, MD, FACOG 05/01/2015 3:16 PM

## 2015-05-01 NOTE — Progress Notes (Signed)
Patient ID: Marikay AlarKiya Baris, female   DOB: 10/31/1990, 24 y.o.   MRN: [id number] Post Partum Day 2 Subjective: Doing well, ambulating, tolerating regular PO diet, voiding spontaneously, tolerating pain with PO meds.  NO CP, SOB, Fever/chills, nausea/vomiting, or calf pain.  Objective: BP 125/68 mmHg  Pulse 65  Temp(Src) 98.2 F (36.8 C) (Oral)  Resp 16  Ht 5\' 8"  (1.727 m)  Wt 148 lb (67.132 kg)  BMI 22.51 kg/m2  SpO2 99%  LMP 06/29/2014  Breastfeeding? Unknown  Physical Exam:  General: NAD, resting comfortably CV: RRR Pulm, nl effort, ctabl Abd: s/nd/nt Lochia: moderate Uterine Fundus: firm and below umbilicus DVT Evaluation: no evidence of DVT, LE nt bilaterally.   Recent Labs  04/29/15 0355 04/30/15 0440  HGB 11.1* 11.2*  HCT 34.3* 33.8*  WBC 9.2  --   PLT 198  --     Assessment/Plan: 23yo G3P3003 s/p SVD ppd #2 1. Continue routine post partum care 2. Anticipate discharge later today 3. Social: See prior notes 4. Request for tubal: I have had an extensive conversation with her regarding possible scenarios for which she may have the desire to have more children, as well as alternatives to permanent sterilization with a LARC.  She has also been counseled about this at her previous doctor's office.  She has thought through her decision and is quite sure of it.  She conceived this pregnancy on contraception and is weary of a LARC's effectiveness.  She understands that there is a high risk of regret associated with a permanent form of sterilization and that she may well want children at some point down the road and will unlikely be able to do so after this surgery.  Even with this, she elects to continue with the surgery.  Discussed risks of surgery.   5. Dispo: likely home this evening.    LOS: 2 days   Conard NovakStephen D. Jermeka Schlotterbeck, MD, [facility] 05/01/2015 7:51 AM

## 2015-05-01 NOTE — Anesthesia Procedure Notes (Signed)
Procedure Name: Intubation Date/Time: 05/01/2015 2:40 PM Performed by: Mathews ArgyleLOGAN, Sheneka Schrom Pre-anesthesia Checklist: Patient identified, Patient being monitored, Timeout performed, Emergency Drugs available and Suction available Patient Re-evaluated:Patient Re-evaluated prior to inductionOxygen Delivery Method: Circle system utilized Preoxygenation: Pre-oxygenation with 100% oxygen Intubation Type: IV induction and Rapid sequence Laryngoscope Size: Miller and 2 Grade View: Grade I Tube type: Oral Tube size: 7.0 mm Number of attempts: 1 Placement Confirmation: ETT inserted through vocal cords under direct vision,  positive ETCO2 and breath sounds checked- equal and bilateral Secured at: 21 cm Tube secured with: Tape Dental Injury: Teeth and Oropharynx as per pre-operative assessment

## 2015-05-02 ENCOUNTER — Encounter: Payer: Self-pay | Admitting: Obstetrics and Gynecology

## 2015-05-02 ENCOUNTER — Encounter: Payer: Medicaid Other | Admitting: Obstetrics & Gynecology

## 2015-05-02 LAB — SURGICAL PATHOLOGY

## 2015-05-02 MED ORDER — IBUPROFEN 600 MG PO TABS: 600.0000 mg | ORAL_TABLET | Freq: Four times a day (QID) | ORAL | Status: DC

## 2015-05-02 MED ORDER — OXYCODONE-ACETAMINOPHEN 5-325 MG PO TABS: 1.0000 | ORAL_TABLET | ORAL | Status: DC | PRN

## 2015-05-02 NOTE — Discharge Summary (Signed)
  Obstetrical Discharge Summary  Date of Admission: 04/29/2015 Date of Discharge: 05/02/2015  Primary OB: out of area   Gestational Age at Delivery: [id number]   Antepartum complications: MJ use Reason for Admission: contractions  Date of Delivery: 04/29/15  Delivered By: T. Brothers, CNM  Delivery Type: spontaneous vaginal delivery Intrapartum complications/course: none Anesthesia: epidural Placenta: manual removal Laceration: none Episiotomy: none Baby: Liveborn female, APGARs8/9, weight 2790 g.   Post partum course: pt with BTL on PPD 2. PP course uncomplicated. Pt discharged on PPD 3.  Disposition: Home  Rh Immune globulin given: not applicable Rubella vaccine given: not applicable Tdap vaccine given in AP or PP setting: no Flu vaccine given in AP or PP setting: not applicable  Contraception: BTL  Prenatal/Postnatal Panel: AB//Rubella Immune//RPR negative//HIV negative/HepB Surface Ag negative////plans to bottle feed  Plan:  Kristina Suarez was discharged to home in good condition. [facility]  Future Appointments Date Time Provider Department Center  05/02/2015 1:45 PM Lazaro ArmsLuther H Eure, MD [facility]    Discharge Medications:   Medication List    STOP taking these medications        acetaminophen-codeine 300-30 MG per tablet  Commonly known as:  TYLENOL #3     amoxicillin 500 MG capsule  Commonly known as:  AMOXIL     Doxylamine-Pyridoxine 10-10 MG Tbec  Commonly known as:  DICLEGIS     terconazole 0.4 % vaginal cream  Commonly known as:  TERAZOL 7      TAKE these medications        FLINSTONES GUMMIES OMEGA-3 DHA Chew  Chew by mouth.     ibuprofen 600 MG tablet  Commonly known as:  ADVIL,MOTRIN  Take 1 tablet (600 mg total) by mouth every 6 (six) hours.     oxyCODONE-acetaminophen 5-325 MG per tablet  Commonly known as:  PERCOCET/ROXICET  Take 1-2 tablets by mouth every 4 (four) hours as needed (for pain scale greater than  7).        Allena NapoleonSubudhi,  Kristina Suarez [facility]

## 2015-05-02 NOTE — Anesthesia Postprocedure Evaluation (Signed)
  Anesthesia Post-op Note  Patient: Kristina Suarez  Procedure(s) Performed: Procedure(s): POST PARTUM BILATERAL TUBAL LIGATION (Bilateral)  Anesthesia type:General ETT  Patient location: PACU  Post pain: Pain level controlled  Post assessment: Post-op Vital signs reviewed, Patient's Cardiovascular Status Stable, Respiratory Function Stable, Patent Airway and No signs of Nausea or vomiting  Post vital signs: Reviewed and stable  Last Vitals:  Filed Vitals:   05/02/15 0838  BP: 119/76  Pulse: 74  Temp: 36.8 C  Resp: 20    Level of consciousness: awake, alert  and patient cooperative  Complications: No apparent anesthesia complications

## 2015-05-03 LAB — SURGICAL PATHOLOGY

## 2015-05-10 ENCOUNTER — Encounter: Payer: Self-pay | Admitting: Obstetrics & Gynecology

## 2015-06-21 ENCOUNTER — Ambulatory Visit: Payer: Medicaid Other | Admitting: Advanced Practice Midwife

## 2015-06-28 ENCOUNTER — Encounter: Payer: Self-pay | Admitting: Advanced Practice Midwife

## 2015-06-28 ENCOUNTER — Ambulatory Visit: Payer: Medicaid Other | Admitting: Advanced Practice Midwife

## 2015-07-05 ENCOUNTER — Ambulatory Visit (INDEPENDENT_AMBULATORY_CARE_PROVIDER_SITE_OTHER): Payer: Medicaid Other | Admitting: Advanced Practice Midwife

## 2015-07-05 ENCOUNTER — Encounter: Payer: Self-pay | Admitting: Advanced Practice Midwife

## 2015-07-05 DIAGNOSIS — N898 Other specified noninflammatory disorders of vagina: Secondary | ICD-10-CM | POA: Diagnosis not present

## 2015-07-05 NOTE — Progress Notes (Signed)
Kristina Suarez is a 24 y.o. who presents for a postpartum visit. She is 9 weeks postpartum following a spontaneous vaginal delivery. I have fully reviewed the prenatal and intrapartum course. The delivery was at 39.2 gestational weeks at [facility] Anesthesia: epidural. Postpartum course has been uneventful. Baby's course has been uneventful. Baby is feeding by bottle. Bleeding: no bleeding. Bowel function is normal. Bladder function is normal. Patient is sexually active. Contraception method is tubal ligation. Postpartum depression screening: negative.  No current outpatient prescriptions on file.  Review of Systems   Constitutional: Negative for fever and chills Eyes: Negative for visual disturbances Respiratory: Negative for shortness of breath, dyspnea Cardiovascular: Negative for chest pain or palpitations  Gastrointestinal: Negative for vomiting, diarrhea and constipation Genitourinary: Negative for dysuria and urgency.  Noticed dc looks yellow for a few weeks Musculoskeletal: Negative for back pain, joint pain, myalgias  Neurological: Negative for dizziness and headaches   Objective:     Filed Vitals:   07/05/15 1053  BP: 90/56   General:  alert, cooperative and no distress   Breasts:  negative  Lungs: clear to auscultation bilaterally  Heart:  regular rate and rhythm  Abdomen: Soft, nontender   Vulva:  normal  Vagina: normal vagina.  DC looks normal. Looks like she's starting her period.  Wet prep neg  Cervix:  closed  Corpus: Well involuted     Rectal Exam: no hemorrhoids        Assessment:    normal postpartum exam.  Plan:    1. Contraception: Had BTL in hosptila 2. Follow up in:   or as needed.

## 2015-10-11 ENCOUNTER — Other Ambulatory Visit: Payer: Self-pay | Admitting: Internal Medicine

## 2015-10-11 DIAGNOSIS — E041 Nontoxic single thyroid nodule: Secondary | ICD-10-CM

## 2015-10-16 ENCOUNTER — Ambulatory Visit: Admission: RE | Admit: 2015-10-16 | Payer: Medicaid Other | Source: Ambulatory Visit

## 2018-08-13 ENCOUNTER — Other Ambulatory Visit: Payer: Self-pay

## 2018-08-13 ENCOUNTER — Emergency Department
Admission: EM | Admit: 2018-08-13 | Discharge: 2018-08-13 | Disposition: A | Discharge status: Home / Self Care | Payer: No Typology Code available for payment source | Attending: Emergency Medicine | Admitting: Emergency Medicine

## 2018-08-13 ENCOUNTER — Emergency Department: Payer: No Typology Code available for payment source

## 2018-08-13 ENCOUNTER — Encounter: Payer: Self-pay | Admitting: Emergency Medicine

## 2018-08-13 DIAGNOSIS — Z87891 Personal history of nicotine dependence: Secondary | ICD-10-CM | POA: Insufficient documentation

## 2018-08-13 DIAGNOSIS — Y9389 Activity, other specified: Secondary | ICD-10-CM | POA: Insufficient documentation

## 2018-08-13 DIAGNOSIS — Y92410 Unspecified street and highway as the place of occurrence of the external cause: Secondary | ICD-10-CM | POA: Diagnosis not present

## 2018-08-13 DIAGNOSIS — M25552 Pain in left hip: Secondary | ICD-10-CM | POA: Insufficient documentation

## 2018-08-13 DIAGNOSIS — Y999 Unspecified external cause status: Secondary | ICD-10-CM | POA: Insufficient documentation

## 2018-08-13 DIAGNOSIS — S46912A Strain of unspecified muscle, fascia and tendon at shoulder and upper arm level, left arm, initial encounter: Secondary | ICD-10-CM | POA: Insufficient documentation

## 2018-08-13 DIAGNOSIS — S4992XA Unspecified injury of left shoulder and upper arm, initial encounter: Secondary | ICD-10-CM | POA: Diagnosis present

## 2018-08-13 DIAGNOSIS — R52 Pain, unspecified: Secondary | ICD-10-CM

## 2018-08-13 LAB — POCT PREGNANCY, URINE: Preg Test, Ur: NEGATIVE

## 2018-08-13 MED ORDER — MELOXICAM 15 MG PO TABS: 15.0000 mg | ORAL_TABLET | Freq: Every day | ORAL | 0 refills | Status: DC

## 2018-08-13 MED ORDER — CYCLOBENZAPRINE HCL 10 MG PO TABS: 10.0000 mg | ORAL_TABLET | Freq: Three times a day (TID) | ORAL | 0 refills | Status: DC | PRN

## 2018-08-13 MED ORDER — BACITRACIN-NEOMYCIN-POLYMYXIN 400-5-5000 EX OINT
TOPICAL_OINTMENT | CUTANEOUS | Status: AC
  Administered 2018-08-13: 1 via TOPICAL
  Filled 2018-08-13: qty 1

## 2018-08-13 MED ORDER — BACITRACIN-NEOMYCIN-POLYMYXIN 400-5-5000 EX OINT
TOPICAL_OINTMENT | Freq: Once | CUTANEOUS | Status: AC
  Administered 2018-08-13: 1 via TOPICAL

## 2018-08-13 MED ORDER — DOUBLE ANTIBIOTIC 500-10000 UNIT/GM EX OINT
TOPICAL_OINTMENT | Freq: Two times a day (BID) | CUTANEOUS | Status: DC
Start: 2018-08-13 — End: 2018-08-13
  Filled 2018-08-13: qty 28.4

## 2018-08-13 NOTE — ED Notes (Signed)
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
## 2018-08-13 NOTE — Discharge Instructions (Signed)
Please follow up with your primary care provider if not improving over the week.  Return to the ER for symptoms that change or worsen if unable to schedule an appointment. 
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
## 2019-06-28 ENCOUNTER — Other Ambulatory Visit: Payer: Self-pay

## 2019-06-28 ENCOUNTER — Encounter: Payer: Self-pay | Admitting: Emergency Medicine

## 2019-06-28 DIAGNOSIS — N760 Acute vaginitis: Secondary | ICD-10-CM | POA: Insufficient documentation

## 2019-06-28 DIAGNOSIS — Z87891 Personal history of nicotine dependence: Secondary | ICD-10-CM | POA: Insufficient documentation

## 2019-06-28 DIAGNOSIS — Z79899 Other long term (current) drug therapy: Secondary | ICD-10-CM | POA: Insufficient documentation

## 2019-06-28 DIAGNOSIS — N3091 Cystitis, unspecified with hematuria: Secondary | ICD-10-CM | POA: Insufficient documentation

## 2019-06-28 DIAGNOSIS — B9689 Other specified bacterial agents as the cause of diseases classified elsewhere: Secondary | ICD-10-CM | POA: Insufficient documentation

## 2019-06-28 LAB — POCT PREGNANCY, URINE: Preg Test, Ur: NEGATIVE

## 2019-06-28 LAB — URINALYSIS, COMPLETE (UACMP) WITH MICROSCOPIC
Bacteria, UA: NONE SEEN
Bilirubin Urine: NEGATIVE
Glucose, UA: NEGATIVE mg/dL
Ketones, ur: NEGATIVE mg/dL
Nitrite: NEGATIVE
Protein, ur: 100 mg/dL — AB
RBC / HPF: >50 RBC/hpf — ABNORMAL HIGH (ref 0–5)
Specific Gravity, Urine: 1.021 (ref 1.005–1.030)
Squamous Epithelial / LPF: NONE SEEN (ref 0–5)
pH: 5 (ref 5.0–8.0)

## 2019-06-28 LAB — COMPREHENSIVE METABOLIC PANEL
ALT: 9 U/L (ref 0–44)
AST: 18 U/L (ref 15–41)
Albumin: 4.2 g/dL (ref 3.5–5.0)
Alkaline Phosphatase: 62 U/L (ref 38–126)
Anion gap: 8 (ref 5–15)
BUN: 15 mg/dL (ref 6–20)
CO2: 26 mmol/L (ref 22–32)
Calcium: 9.1 mg/dL (ref 8.9–10.3)
Chloride: 105 mmol/L (ref 98–111)
Creatinine, Ser: 0.69 mg/dL (ref 0.44–1.00)
GFR calc Af Amer: >60 mL/min (ref >60)
GFR calc non Af Amer: >60 mL/min (ref >60)
Glucose, Bld: 100 mg/dL — ABNORMAL HIGH (ref 70–99)
Potassium: 3.6 mmol/L (ref 3.5–5.1)
Sodium: 139 mmol/L (ref 135–145)
Total Bilirubin: 0.3 mg/dL (ref 0.3–1.2)
Total Protein: 7.8 g/dL (ref 6.5–8.1)

## 2019-06-28 NOTE — ED Triage Notes (Signed)
Patient ambulatory to triage with steady gait, without difficulty or distress noted, mask in place; pt reports hematuria and pelvic pain; denies any abd or back pain

## 2019-06-29 ENCOUNTER — Emergency Department
Admission: EM | Admit: 2019-06-29 | Discharge: 2019-06-29 | Disposition: A | Discharge status: Home / Self Care | Payer: Medicaid Other | Attending: Emergency Medicine | Admitting: Emergency Medicine

## 2019-06-29 DIAGNOSIS — R319 Hematuria, unspecified: Secondary | ICD-10-CM

## 2019-06-29 DIAGNOSIS — N3090 Cystitis, unspecified without hematuria: Secondary | ICD-10-CM

## 2019-06-29 DIAGNOSIS — B9689 Other specified bacterial agents as the cause of diseases classified elsewhere: Secondary | ICD-10-CM

## 2019-06-29 LAB — WET PREP, GENITAL
Sperm: NONE SEEN
Trich, Wet Prep: NONE SEEN
Yeast Wet Prep HPF POC: NONE SEEN

## 2019-06-29 MED ORDER — CEFUROXIME AXETIL 250 MG PO TABS: 250.0000 mg | ORAL_TABLET | Freq: Two times a day (BID) | ORAL | 0 refills | Status: AC

## 2019-06-29 MED ORDER — METRONIDAZOLE 500 MG PO TABS: 500.0000 mg | ORAL_TABLET | Freq: Two times a day (BID) | ORAL | 0 refills | Status: DC

## 2019-06-29 MED ORDER — CEFUROXIME AXETIL 250 MG PO TABS: 250.0000 mg | ORAL_TABLET | Freq: Two times a day (BID) | ORAL | 0 refills | Status: DC

## 2019-06-29 MED ORDER — METRONIDAZOLE 500 MG PO TABS: 500.0000 mg | ORAL_TABLET | Freq: Two times a day (BID) | ORAL | 0 refills | Status: AC

## 2019-06-29 MED ORDER — CEFTRIAXONE SODIUM 250 MG IJ SOLR
250.0000 mg | Freq: Once | INTRAMUSCULAR | Status: AC
  Administered 2019-06-29: 250 mg via INTRAMUSCULAR
  Filled 2019-06-29: qty 250

## 2019-06-29 MED ORDER — AZITHROMYCIN 500 MG PO TABS
1000.0000 mg | ORAL_TABLET | Freq: Once | ORAL | Status: AC
  Administered 2019-06-29: 1000 mg via ORAL
  Filled 2019-06-29: qty 2

## 2019-06-29 NOTE — ED Notes (Signed)
Lab results reviewed. Awaiting room for MD eval. Patient visualized in lobby in no acute distress.  
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
## 2019-06-29 NOTE — ED Provider Notes (Signed)
[facility] Emergency Department Provider Note  ____________________________________________   First MD Initiated Contact with Patient 06/29/19 [phone]     (approximate)  I have reviewed the triage vital signs and the nursing notes.   HISTORY  Chief Complaint Hematuria    HPI Kristina Suarez is a 28 y.o. female who presents with hematuria.  Patient reports a few days of hematuria.  She says she is had this previously in the setting of a UTI but never this severe.  She does have some mild clots that are coming out as well.  Denies any urinary retention sensation.  She also endorses being constipated with her last bowel movement 6 days ago.  No prior bowel movements.  She has taken some over-the-counter pills that have not been helping.  She also endorses having some pain at her rectum.  She thinks she may have a hemorrhoid.  She denies any vaginal bleeding.  No abdominal pain.          Past Medical History:  Diagnosis Date  . Pregnant 10/04/2014  . Vaginal Pap smear, abnormal     Patient Active Problem List   Diagnosis Date Noted  . Postpartum care following vaginal delivery 05/02/2015  . Labor and delivery, indication for care 04/29/2015  . Fetal cardiac echogenic focus, antepartum 02/27/2015  . Limited prenatal care, antepartum 02/07/2015  . Contraception management : [facility] desired 12/25/2014  . Marijuana use 11/07/2014    Past Surgical History:  Procedure Laterality Date  . CERVICAL CONE BIOPSY  2011  . TUBAL LIGATION Bilateral 05/01/2015   Procedure: POST PARTUM BILATERAL TUBAL LIGATION;  Surgeon: Conard NovakStephen D Jackson, MD;  Location: [facility] ORS;  Service: Gynecology;  Laterality: Bilateral;    Prior to Admission medications   Medication Sig Start Date End Date Taking? Authorizing Provider  cyclobenzaprine (FLEXERIL) 10 MG tablet Take 1 tablet (10 mg total) by mouth 3 (three) times daily as needed for muscle spasms. 08/13/18   Triplett, Rulon Eisenmengerari B, FNP   meloxicam (MOBIC) 15 MG tablet Take 1 tablet (15 mg total) by mouth daily. 08/13/18   Chinita Pesterriplett, Cari B, FNP    Allergies Patient has no known allergies.  Family History  Problem Relation Age of Onset  . Diabetes Maternal Grandmother   . Hypertension Maternal Grandmother   . Other Maternal Grandmother        heart problems  . HIV Mother   . ADD / ADHD Sister     Social History Social History   Tobacco Use  . Smoking status: Former Smoker    Types: Cigarettes  . Smokeless tobacco: Never Used  Substance Use Topics  . Alcohol use: No  . Drug use: No    Types: Marijuana      Review of Systems Constitutional: No fever/chills Eyes: No visual changes. ENT: No sore throat. Cardiovascular: Denies chest pain. Respiratory: Denies shortness of breath. Gastrointestinal: No abdominal pain.  No nausea, no vomiting.  No diarrhea.  +rectal pain.  +constipation  Genitourinary: Positive for hematuria Musculoskeletal: Negative for back pain. Skin: Negative for rash. Neurological: Negative for headaches, focal weakness or numbness. All other ROS negative ____________________________________________   PHYSICAL EXAM:  VITAL SIGNS: ED Triage Vitals [06/28/19 2235]  Enc Vitals Group     BP 120/83     Pulse Rate 90     Resp 18     Temp 98.4 F (36.9 C)     Temp Source Oral     SpO2 99 %  Weight 121 lb (54.9 kg)     Height 5\' 8"  (1.727 m)     Head Circumference      Peak Flow      Pain Score 6     Pain Loc      Pain Edu?      Excl. in GC?     Constitutional: Alert and oriented. Well appearing and in no acute distress. Eyes: Conjunctivae are normal. EOMI. Head: Atraumatic. Nose: No congestion/rhinnorhea. Mouth/Throat: Mucous membranes are moist.   Neck: No stridor. Trachea Midline. FROM Cardiovascular: Normal rate, regular rhythm. Grossly normal heart sounds.  Good peripheral circulation. Respiratory: Normal respiratory effort.  No retractions. Lungs CTAB.  Gastrointestinal: Soft and nontender. No distention. No abdominal bruits.  Musculoskeletal: No lower extremity tenderness nor edema.  No joint effusions. Neurologic:  Normal speech and language. No gross focal neurologic deficits are appreciated.  Skin:  Skin is warm, dry and intact. No rash noted. Psychiatric: Mood and affect are normal. Speech and behavior are normal. GU: discharge, no CMT, no adnexal tenderness.   Rectal exam: no abscess, no external hemorrhoid, internal hemorrhoid felt, no blood, no stool ball.   ____________________________________________   LABS (all labs ordered are listed, but only abnormal results are displayed)  Labs Reviewed  WET PREP, GENITAL - Abnormal; Notable for the following components:      Result Value   Clue Cells Wet Prep HPF POC PRESENT (*)    WBC, Wet Prep HPF POC TOO NUMEROUS TO COUNT (*)    All other components within normal limits  URINALYSIS, COMPLETE (UACMP) WITH MICROSCOPIC - Abnormal; Notable for the following components:   Color, Urine ORANGE (*)    APPearance CLOUDY (*)    Hgb urine dipstick LARGE (*)    Protein, ur 100 (*)    Leukocytes,Ua MODERATE (*)    RBC / HPF >50 (*)    All other components within normal limits  CBC WITH DIFFERENTIAL/PLATELET - Abnormal; Notable for the following components:   Hemoglobin 11.4 (*)    HCT 47.3 (*)    MCV 112.1 (*)    MCHC 24.1 (*)    All other components within normal limits  COMPREHENSIVE METABOLIC PANEL - Abnormal; Notable for the following components:   Glucose, Bld 100 (*)    All other components within normal limits  GC/CHLAMYDIA PROBE AMP  POCT PREGNANCY, URINE     INITIAL IMPRESSION / ASSESSMENT AND PLAN / ED COURSE  Kristina Suarez was evaluated in Emergency Department on 06/29/2019 for the symptoms described in the history of present illness. She was evaluated in the context of the global COVID-19 pandemic, which necessitated consideration that the patient might be at risk for  infection with the SARS-CoV-2 virus that causes COVID-19. Institutional protocols and algorithms that pertain to the evaluation of patients at risk for COVID-19 are in a state of rapid change based on information released by regulatory bodies including the CDC and federal and state organizations. These policies and algorithms were followed during the patient's care in the ED.    Patient is a well-appearing 28 year old who presents with hematuria.  Will get pregnancy test to rule out pregnancy.  Will do pelvic exam to ensure this not coming from the vagina and to screen for STDs per patient's request.  We will also get UA to evaluate for UTI.  Will do post void bladder scan to evaluate for retention.  Will do labs to evaluate for anemia, AKI.  For patient's rectal pain most  likely secondary to constipation.  Patient has a soft abdomen.  No prior abdominal surgeries it puts her at high risk for SBO.  She denies history of kidney stones, no flank tenderness to suggest kidney stone.  She is not having  vomiting.  This most likely just constipation.  No evidence of abscess, fistula, fissure.  Patient does have an internal hemorrhoid felt palpable on the inside.   UA with greater than 50 RBCs and 21-50 WBCs.  Hemoglobin stable at 11.4. Pregnancy test is negative.  Pelvic exam with discharge positive for clue cells.  We will give patient a course of cefuroxime for UTI as well as Flagyl for bacterial vaginosis.  Patient treated prophylactically for gonorrhea chlamydia.  Post void bladder scan was 70 cc and no evidence of retention.  Recommended taking MiraLAX daily for bowel movements.  Pt Elected to hold off on enema at this time.  Patient is well-appearing with stable vital signs and reassuring labs and abdominal exam.     ____________________________________________   FINAL CLINICAL IMPRESSION(S) / ED DIAGNOSES   Final diagnoses:  Hematuria, unspecified type  Cystitis  Bacterial vaginosis       MEDICATIONS GIVEN DURING THIS VISIT:  Medications  cefTRIAXone (ROCEPHIN) injection 250 mg (has no administration in time range)  azithromycin (ZITHROMAX) tablet 1,000 mg (has no administration in time range)     ED Discharge Orders         Ordered    cefUROXime (CEFTIN) 250 MG tablet  2 times daily     06/29/19 0509    metroNIDAZOLE (FLAGYL) 500 MG tablet  2 times daily     06/29/19 0511           Note:  This document was prepared using Dragon voice recognition software and may include unintentional dictation errors.   Vanessa Walker, MD 06/29/19 [phone]

## 2019-06-29 NOTE — Discharge Instructions (Addendum)
We are treating you for a urinary infection.  You should return to the ER if you develop fevers, back pain, vomiting, inability to urinate.  We also treated you prophylactically for gonorrhea chlamydia.  We also treat you with Flagyl for bacterial vaginosis.  You should not drink alcohol with this.  You should take MiraLAX 1 capful daily until you develop a bowel movement.  If you develop vomiting or any other concerns you should return to the ER.

## 2019-06-30 LAB — CBC WITH DIFFERENTIAL/PLATELET
Abs Immature Granulocytes: 0.02 10*3/uL (ref 0.00–0.07)
Basophils Absolute: 0 10*3/uL (ref 0.0–0.1)
Basophils Relative: 1 %
Eosinophils Absolute: 0.2 10*3/uL (ref 0.0–0.5)
Eosinophils Relative: 3 %
HCT: 47.3 % — ABNORMAL HIGH (ref 36.0–46.0)
Hemoglobin: 11.4 g/dL — ABNORMAL LOW (ref 12.0–15.0)
Immature Granulocytes: 0 %
Lymphocytes Relative: 38 %
Lymphs Abs: 2.6 10*3/uL (ref 0.7–4.0)
MCH: 27 pg (ref 26.0–34.0)
MCHC: 24.1 g/dL — ABNORMAL LOW (ref 30.0–36.0)
MCV: 92.2 fL (ref 80.0–100.0)
Monocytes Absolute: 0.6 10*3/uL (ref 0.1–1.0)
Monocytes Relative: 9 %
Neutro Abs: 3.4 10*3/uL (ref 1.7–7.7)
Neutrophils Relative %: 49 %
Platelets: 272 10*3/uL (ref 150–400)
RBC: 4.22 MIL/uL (ref 3.87–5.11)
RDW: 14.7 % (ref 11.5–15.5)
WBC: 6.8 10*3/uL (ref 4.0–10.5)
nRBC: 0 % (ref 0.0–0.2)

## 2019-07-07 ENCOUNTER — Telehealth: Payer: Self-pay | Admitting: Emergency Medicine

## 2019-07-07 LAB — GC/CHLAMYDIA PROBE AMP
Chlamydia trachomatis, NAA: POSITIVE — AB
Neisseria Gonorrhoeae by PCR: POSITIVE — AB

## 2019-07-07 NOTE — Telephone Encounter (Signed)
Called patient and informed of gonorrhea and chlamydia results positive and explained need for partner treatement and free treatment available at [facility].

## 2021-12-16 ENCOUNTER — Ambulatory Visit: Payer: Self-pay | Admitting: Nurse Practitioner

## 2022-02-21 ENCOUNTER — Encounter: Payer: Self-pay | Admitting: Nurse Practitioner

## 2022-02-21 ENCOUNTER — Other Ambulatory Visit: Payer: Self-pay

## 2022-02-21 ENCOUNTER — Ambulatory Visit: Payer: BC Managed Care – PPO | Admitting: Nurse Practitioner

## 2022-02-21 ENCOUNTER — Other Ambulatory Visit (HOSPITAL_COMMUNITY)
Admission: RE | Admit: 2022-02-21 | Discharge: 2022-02-21 | Disposition: A | Discharge status: Home / Self Care | Payer: Self-pay | Source: Ambulatory Visit | Attending: Nurse Practitioner | Admitting: Nurse Practitioner

## 2022-02-21 VITALS — BP 118/72 | HR 81 | Temp 98.2°F | Resp 18 | Ht 68.0 in | Wt 125.0 lb

## 2022-02-21 DIAGNOSIS — R350 Frequency of micturition: Secondary | ICD-10-CM | POA: Diagnosis not present

## 2022-02-21 DIAGNOSIS — Z1159 Encounter for screening for other viral diseases: Secondary | ICD-10-CM

## 2022-02-21 DIAGNOSIS — Z131 Encounter for screening for diabetes mellitus: Secondary | ICD-10-CM | POA: Diagnosis not present

## 2022-02-21 DIAGNOSIS — Z13220 Encounter for screening for lipoid disorders: Secondary | ICD-10-CM | POA: Diagnosis not present

## 2022-02-21 DIAGNOSIS — Z7689 Persons encountering health services in other specified circumstances: Secondary | ICD-10-CM

## 2022-02-21 DIAGNOSIS — Z130 Encounter for screening for diseases of the blood and blood-forming organs and certain disorders involving the immune mechanism: Secondary | ICD-10-CM | POA: Diagnosis not present

## 2022-02-21 DIAGNOSIS — N6312 Unspecified lump in the right breast, upper inner quadrant: Secondary | ICD-10-CM

## 2022-02-21 DIAGNOSIS — Z23 Encounter for immunization: Secondary | ICD-10-CM | POA: Diagnosis not present

## 2022-02-21 DIAGNOSIS — T3695XA Adverse effect of unspecified systemic antibiotic, initial encounter: Secondary | ICD-10-CM

## 2022-02-21 DIAGNOSIS — B379 Candidiasis, unspecified: Secondary | ICD-10-CM

## 2022-02-21 LAB — POCT URINALYSIS DIPSTICK
Bilirubin, UA: NEGATIVE
Glucose, UA: NEGATIVE
Ketones, UA: NEGATIVE
Nitrite, UA: NEGATIVE
Protein, UA: POSITIVE — AB
Spec Grav, UA: 1.025 (ref 1.010–1.025)
Urobilinogen, UA: 0.2 E.U./dL
pH, UA: 6 (ref 5.0–8.0)

## 2022-02-21 MED ORDER — FLUCONAZOLE 150 MG PO TABS: 150.0000 mg | ORAL_TABLET | ORAL | 0 refills | Status: DC | PRN

## 2022-02-21 MED ORDER — CEPHALEXIN 500 MG PO CAPS: 500.0000 mg | ORAL_CAPSULE | Freq: Two times a day (BID) | ORAL | 0 refills | Status: AC

## 2022-02-21 NOTE — Progress Notes (Signed)
? ?BP 118/72   Pulse 81   Temp 98.2 ?F (36.8 ?C) (Oral)   Resp 18   Ht 5\' 8"  (1.727 m)   Wt 125 lb (56.7 kg)   LMP 02/18/2022   SpO2 98%   BMI 19.01 kg/m?   ? ?Subjective:  ? ? Patient ID: Kristina Suarez, female    DOB: 06-Jan-1991, 31 y.o.   MRN: [id number] ? ?HPI: ?Kristina Suarez is a 31 y.o. female, here alone ? ?Chief Complaint  ?Patient presents with  ? Establish Care  ? Breast Problem  ?  Right breast nodule  ? Urinary Tract Infection  ?  Burning, pressure  ? ?Establish care: She thinks her last physical was about 6 years ago. She denies any medical history or medications. LMC: just finished ? ?Breast lump: She says that she has noticed a lump about the size of a marble on her right breast about a year ago. She says that it feels like it has gotten bigger. It is not painful.  She denies any family history of breast cancer. She denies any changes to skin or nipple discharge.  Exam patient does have a lump about the size of a marble in her right breast in the upper inner quadrant.  Will order mammogram diagnostic and ultrasound. ? ?Dysuria/frequency:  She says about 2 weeks ago.  She say that she is feeling like she has a lot of urine frequency and pressure.  She says she did take some over-the-counter Azo for her symptoms that it helped.  Will send urine for culture, and start treatment with Keflex.  She is also  sexually active, get a vaginal swab. She sometimes gets yeast infections with antibiotics, will send in Diflucan. ? ?Relevant past medical, surgical, family and social history reviewed and updated as indicated. Interim medical history since our last visit reviewed. ?Allergies and medications reviewed and updated. ? ?Review of Systems ? ?Constitutional: Negative for fever or weight change.  ?Respiratory: Negative for cough and shortness of breath.   ?Cardiovascular: Negative for chest pain or palpitations.  ?Gastrointestinal: Negative for abdominal pain, no bowel changes.  ?GU: positive for urinary  frequency and dysuria ?Musculoskeletal: Negative for gait problem or joint swelling.  ?Skin: Negative for rash.  ?Neurological: Negative for dizziness or headache.  ?No other specific complaints in a complete review of systems (except as listed in HPI above).  ? ?   ?Objective:  ?  ?BP 118/72   Pulse 81   Temp 98.2 ?F (36.8 ?C) (Oral)   Resp 18   Ht 5\' 8"  (1.727 m)   Wt 125 lb (56.7 kg)   LMP 02/18/2022   SpO2 98%   BMI 19.01 kg/m?   ?Wt Readings from Last 3 Encounters:  ?02/21/22 125 lb (56.7 kg)  ?06/28/19 121 lb (54.9 kg)  ?08/13/18 120 lb (54.4 kg)  ?  ?Physical Exam ? ?Constitutional: Patient appears well-developed and well-nourished.  No distress.  ?HEENT: head atraumatic, normocephalic, pupils equal and reactive to light,  neck supple ?Cardiovascular: Normal rate, regular rhythm and normal heart sounds.  No murmur heard. No BLE edema. ?Breast: Breasts: left breast normal without mass, skin or nipple changes or axillary nodes, abnormal mass palpable on right upper inner quadrant, about 1 o'clock, about the size of a marble.  ?Pulmonary/Chest: Effort normal and breath sounds normal. No respiratory distress. ?Abdominal: Soft.  There is no tenderness. ?Psychiatric: Patient has a normal mood and affect. behavior is normal. Judgment and thought content normal.  ?Results  for orders placed or performed in visit on 02/21/22  ?POCT urinalysis dipstick  ?Result Value Ref Range  ? Color, UA yellow   ? Clarity, UA clear   ? Glucose, UA Negative Negative  ? Bilirubin, UA negative   ? Ketones, UA negative   ? Spec Grav, UA 1.025 1.010 - 1.025  ? Blood, UA large   ? pH, UA 6.0 5.0 - 8.0  ? Protein, UA Positive (A) Negative  ? Urobilinogen, UA 0.2 0.2 or 1.0 E.U./dL  ? Nitrite, UA negative   ? Leukocytes, UA Large (3+) (A) Negative  ? Appearance clear   ? Odor none   ? ?   ?Assessment & Plan:  ? ?1. Mass of upper inner quadrant of right breast ? ?- MM DIAG BREAST TOMO UNI RIGHT; Future ?- US BREAST LTD UNI RIGHT INC  AXILLA; Future ? ?2. Urinary frequency ? ?- POCT urinalysis dipstick ?- Urine Culture ?- cephALEXin (KEFLEX) 500 MG capsule; Take 1 capsule (500 mg total) by mouth 2 (two) times daily for 5 days.  Dispense: 10 capsule; Refill: 0 ?- Cervicovaginal ancillary only ? ?3. Screening for diabetes mellitus ? ?- COMPLETE METABOLIC PANEL WITH GFR ?- Hemoglobin A1c ? ?4. Screening for cholesterol level ? ?- Lipid panel ? ?5. Screening for deficiency anemia ? ?- CBC with Differential/Platelet ? ?6. Encounter for hepatitis C screening test for low risk patient ?- Hepatitis C antibody ? ?7. Need for Tdap vaccination ? ?- Tdap vaccine greater than or equal to 7yo IM ? ?8. Encounter to establish care ?Scheduled for CPE and Pap ? ?9. Antibiotic-induced yeast infection ? ?- fluconazole (DIFLUCAN) 150 MG tablet; Take 1 tablet (150 mg total) by mouth every 3 (three) days as needed (for vaginal itching/yeast infection sx).  Dispense: 2 tablet; Refill: 0  ? ?Follow up plan: ?Return in about 3 months (around 05/23/2022) for cpe with PAP. ? ? ? ? ? ?

## 2022-02-22 LAB — URINE CULTURE
MICRO NUMBER:: 13327097
Result:: NO GROWTH
SPECIMEN QUALITY:: ADEQUATE

## 2022-02-23 LAB — COMPLETE METABOLIC PANEL WITH GFR
AG Ratio: 1.7 (calc) (ref 1.0–2.5)
ALT: 7 U/L (ref 6–29)
AST: 16 U/L (ref 10–30)
Albumin: 4.5 g/dL (ref 3.6–5.1)
Alkaline phosphatase (APISO): 55 U/L (ref 31–125)
BUN: 12 mg/dL (ref 7–25)
CO2: 27 mmol/L (ref 20–32)
Calcium: 9.6 mg/dL (ref 8.6–10.2)
Chloride: 106 mmol/L (ref 98–110)
Creat: 0.69 mg/dL (ref 0.50–0.97)
Globulin: 2.7 g/dL (calc) (ref 1.9–3.7)
Glucose, Bld: 58 mg/dL — ABNORMAL LOW (ref 65–99)
Potassium: 3.9 mmol/L (ref 3.5–5.3)
Sodium: 141 mmol/L (ref 135–146)
Total Bilirubin: 0.7 mg/dL (ref 0.2–1.2)
Total Protein: 7.2 g/dL (ref 6.1–8.1)
eGFR: 118 mL/min/{1.73_m2} (ref >=60)

## 2022-02-23 LAB — CBC WITH DIFFERENTIAL/PLATELET
Absolute Monocytes: 382 cells/uL (ref 200–950)
Basophils Absolute: 29 cells/uL (ref 0–200)
Basophils Relative: 0.5 %
Eosinophils Absolute: 40 cells/uL (ref 15–500)
Eosinophils Relative: 0.7 %
HCT: 38.5 % (ref 35.0–45.0)
Hemoglobin: 13 g/dL (ref 11.7–15.5)
Lymphs Abs: 2457 cells/uL (ref 850–3900)
MCH: 31.4 pg (ref 27.0–33.0)
MCHC: 33.8 g/dL (ref 32.0–36.0)
MCV: 93 fL (ref 80.0–100.0)
MPV: 11.4 fL (ref 7.5–12.5)
Monocytes Relative: 6.7 %
Neutro Abs: 2793 cells/uL (ref 1500–7800)
Neutrophils Relative %: 49 %
Platelets: 236 10*3/uL (ref 140–400)
RBC: 4.14 10*6/uL (ref 3.80–5.10)
RDW: 12.1 % (ref 11.0–15.0)
Total Lymphocyte: 43.1 %
WBC: 5.7 10*3/uL (ref 3.8–10.8)

## 2022-02-23 LAB — LIPID PANEL
Cholesterol: 130 mg/dL (ref <200)
HDL: 51 mg/dL (ref >=50)
LDL Cholesterol (Calc): 67 mg/dL (calc)
Non-HDL Cholesterol (Calc): 79 mg/dL (calc) (ref <130)
Total CHOL/HDL Ratio: 2.5 (calc) (ref <5.0)
Triglycerides: 42 mg/dL (ref <150)

## 2022-02-23 LAB — HEPATITIS C ANTIBODY
Hepatitis C Ab: NONREACTIVE
SIGNAL TO CUT-OFF: 0.19 (ref <1.00)

## 2022-02-23 LAB — HEMOGLOBIN A1C
Hgb A1c MFr Bld: 5 % of total Hgb (ref <5.7)
Mean Plasma Glucose: 97 mg/dL
eAG (mmol/L): 5.4 mmol/L

## 2022-02-24 NOTE — Progress Notes (Signed)
Patient notified of labs.   

## 2022-02-25 ENCOUNTER — Other Ambulatory Visit: Payer: Self-pay | Admitting: Nurse Practitioner

## 2022-02-25 DIAGNOSIS — A549 Gonococcal infection, unspecified: Secondary | ICD-10-CM

## 2022-02-25 LAB — CERVICOVAGINAL ANCILLARY ONLY
Bacterial Vaginitis (gardnerella): POSITIVE — AB
Candida Glabrata: NEGATIVE
Candida Vaginitis: NEGATIVE
Chlamydia: NEGATIVE
Comment: NEGATIVE
Comment: NEGATIVE
Comment: NEGATIVE
Comment: NEGATIVE
Comment: NEGATIVE
Comment: NORMAL
Neisseria Gonorrhea: POSITIVE — AB
Trichomonas: NEGATIVE

## 2022-02-26 ENCOUNTER — Other Ambulatory Visit: Payer: Self-pay | Admitting: Nurse Practitioner

## 2022-02-26 ENCOUNTER — Other Ambulatory Visit: Payer: Self-pay

## 2022-02-26 ENCOUNTER — Ambulatory Visit: Payer: BC Managed Care – PPO | Admitting: Nurse Practitioner

## 2022-02-26 ENCOUNTER — Encounter: Payer: Self-pay | Admitting: Nurse Practitioner

## 2022-02-26 VITALS — BP 120/72 | HR 68 | Temp 98.1°F | Resp 16 | Ht 68.0 in | Wt 125.3 lb

## 2022-02-26 DIAGNOSIS — B9689 Other specified bacterial agents as the cause of diseases classified elsewhere: Secondary | ICD-10-CM | POA: Diagnosis not present

## 2022-02-26 DIAGNOSIS — A549 Gonococcal infection, unspecified: Secondary | ICD-10-CM | POA: Diagnosis not present

## 2022-02-26 DIAGNOSIS — N760 Acute vaginitis: Secondary | ICD-10-CM

## 2022-02-26 DIAGNOSIS — N6312 Unspecified lump in the right breast, upper inner quadrant: Secondary | ICD-10-CM

## 2022-02-26 MED ORDER — METRONIDAZOLE 500 MG PO TABS: 500.0000 mg | ORAL_TABLET | Freq: Two times a day (BID) | ORAL | 0 refills | Status: AC

## 2022-02-26 MED ORDER — CEFTRIAXONE SODIUM 500 MG IJ SOLR
500.0000 mg | Freq: Once | INTRAMUSCULAR | Status: AC
  Administered 2022-02-26: 500 mg via INTRAMUSCULAR

## 2022-02-26 NOTE — Progress Notes (Signed)
? ?BP 120/72   Pulse 68   Temp 98.1 ?F (36.7 ?C) (Oral)   Resp 16   Ht 5' 8"  (1.727 m)   Wt 125 lb 4.8 oz (56.8 kg)   LMP 02/18/2022   SpO2 98%   BMI 19.05 kg/m?   ? ?Subjective:  ? ? Patient ID: Kristina Suarez, female    DOB: 1991-01-19, 31 y.o.   MRN: [id number] ? ?HPI: ?Kristina Suarez is a 31 y.o. female ? ?Chief Complaint  ?Patient presents with  ? Follow-up  ?  STD check  ? ?Gonorrhea/bacterial vaginosis: Patient really recently had a vaginal swab that came back positive for gonorrhea and bacterial vaginosis.  Patient denies any vaginal discharge.  Discussed that gonorrhea is a sexually transmitted illness.  Discussed with patient that partner will need to be tested.  We will treat patient with Rocephin 500 mg IM and prescription for Flagyl 500 mg twice daily.  ? ?Relevant past medical, surgical, family and social history reviewed and updated as indicated. Interim medical history since our last visit reviewed. ?Allergies and medications reviewed and updated. ? ?Review of Systems ? ?Constitutional: Negative for fever or weight change.  ?Respiratory: Negative for cough and shortness of breath.   ?Cardiovascular: Negative for chest pain or palpitations.  ?Gastrointestinal: Negative for abdominal pain, no bowel changes.  ?Musculoskeletal: Negative for gait problem or joint swelling.  ?Skin: Negative for rash.  ?Neurological: Negative for dizziness or headache.  ?No other specific complaints in a complete review of systems (except as listed in HPI above).  ? ?   ?Objective:  ?  ?BP 120/72   Pulse 68   Temp 98.1 ?F (36.7 ?C) (Oral)   Resp 16   Ht 5' 8"  (1.727 m)   Wt 125 lb 4.8 oz (56.8 kg)   LMP 02/18/2022   SpO2 98%   BMI 19.05 kg/m?   ?Wt Readings from Last 3 Encounters:  ?02/26/22 125 lb 4.8 oz (56.8 kg)  ?02/21/22 125 lb (56.7 kg)  ?06/28/19 121 lb (54.9 kg)  ?  ?Physical Exam ? ?Constitutional: Patient appears well-developed and well-nourished.  No distress.  ?HEENT: head atraumatic, normocephalic,  pupils equal and reactive to light, neck supple ?Cardiovascular: Normal rate, regular rhythm and normal heart sounds.  No murmur heard. No BLE edema. ?Pulmonary/Chest: Effort normal and breath sounds normal. No respiratory distress. ?Abdominal: Soft.  There is no tenderness. ?Psychiatric: Patient has a normal mood and affect. behavior is normal. Judgment and thought content normal.  ?Results for orders placed or performed in visit on 02/21/22  ?Urine Culture  ? Specimen: Urine  ?Result Value Ref Range  ? MICRO NUMBER: [id number]   ? SPECIMEN QUALITY: Adequate   ? Sample Source URINE   ? STATUS: FINAL   ? Result: No Growth   ?Lipid panel  ?Result Value Ref Range  ? Cholesterol 130 <200 mg/dL  ? HDL 51 > OR = 50 mg/dL  ? Triglycerides 42 <150 mg/dL  ? LDL Cholesterol (Calc) 67 mg/dL (calc)  ? Total CHOL/HDL Ratio 2.5 <5.0 (calc)  ? Non-HDL Cholesterol (Calc) 79 <130 mg/dL (calc)  ?COMPLETE METABOLIC PANEL WITH GFR  ?Result Value Ref Range  ? Glucose, Bld 58 (L) 65 - 99 mg/dL  ? BUN 12 7 - 25 mg/dL  ? Creat 0.69 0.50 - 0.97 mg/dL  ? eGFR 118 > OR = 60 mL/min/1.44m  ? BUN/Creatinine Ratio NOT APPLICABLE 6 - 22 (calc)  ? Sodium 141 135 - 146 mmol/L  ? Potassium  3.9 3.5 - 5.3 mmol/L  ? Chloride 106 98 - 110 mmol/L  ? CO2 27 20 - 32 mmol/L  ? Calcium 9.6 8.6 - 10.2 mg/dL  ? Total Protein 7.2 6.1 - 8.1 g/dL  ? Albumin 4.5 3.6 - 5.1 g/dL  ? Globulin 2.7 1.9 - 3.7 g/dL (calc)  ? AG Ratio 1.7 1.0 - 2.5 (calc)  ? Total Bilirubin 0.7 0.2 - 1.2 mg/dL  ? Alkaline phosphatase (APISO) 55 31 - 125 U/L  ? AST 16 10 - 30 U/L  ? ALT 7 6 - 29 U/L  ?CBC with Differential/Platelet  ?Result Value Ref Range  ? WBC 5.7 3.8 - 10.8 Thousand/uL  ? RBC 4.14 3.80 - 5.10 Million/uL  ? Hemoglobin 13.0 11.7 - 15.5 g/dL  ? HCT 38.5 35.0 - 45.0 %  ? MCV 93.0 80.0 - 100.0 fL  ? MCH 31.4 27.0 - 33.0 pg  ? MCHC 33.8 32.0 - 36.0 g/dL  ? RDW 12.1 11.0 - 15.0 %  ? Platelets 236 140 - 400 Thousand/uL  ? MPV 11.4 7.5 - 12.5 fL  ? Neutro Abs 2,793 1,500 - 7,800  cells/uL  ? Lymphs Abs 2,457 850 - 3,900 cells/uL  ? Absolute Monocytes 382 200 - 950 cells/uL  ? Eosinophils Absolute 40 15 - 500 cells/uL  ? Basophils Absolute 29 0 - 200 cells/uL  ? Neutrophils Relative % 49 %  ? Total Lymphocyte 43.1 %  ? Monocytes Relative 6.7 %  ? Eosinophils Relative 0.7 %  ? Basophils Relative 0.5 %  ?Hemoglobin A1c  ?Result Value Ref Range  ? Hgb A1c MFr Bld 5.0 <5.7 % of total Hgb  ? Mean Plasma Glucose 97 mg/dL  ? eAG (mmol/L) 5.4 mmol/L  ?Hepatitis C antibody  ?Result Value Ref Range  ? Hepatitis C Ab NON-REACTIVE NON-REACTIVE  ? SIGNAL TO CUT-OFF 0.19 <1.00  ?POCT urinalysis dipstick  ?Result Value Ref Range  ? Color, UA yellow   ? Clarity, UA clear   ? Glucose, UA Negative Negative  ? Bilirubin, UA negative   ? Ketones, UA negative   ? Spec Grav, UA 1.025 1.010 - 1.025  ? Blood, UA large   ? pH, UA 6.0 5.0 - 8.0  ? Protein, UA Positive (A) Negative  ? Urobilinogen, UA 0.2 0.2 or 1.0 E.U./dL  ? Nitrite, UA negative   ? Leukocytes, UA Large (3+) (A) Negative  ? Appearance clear   ? Odor none   ?Cervicovaginal ancillary only  ?Result Value Ref Range  ? Neisseria Gonorrhea Positive (A)   ? Chlamydia Negative   ? Trichomonas Negative   ? Bacterial Vaginitis (gardnerella) Positive (A)   ? Candida Vaginitis Negative   ? Candida Glabrata Negative   ? Comment    ?  Normal Reference Range Bacterial Vaginosis - Negative  ? Comment Normal Reference Range Candida Species - Negative   ? Comment Normal Reference Range Candida Galbrata - Negative   ? Comment Normal Reference Range Trichomonas - Negative   ? Comment Normal Reference Ranger Chlamydia - Negative   ? Comment    ?  Normal Reference Range Neisseria Gonorrhea - Negative  ? ?   ?Assessment & Plan:  ? ?Problem List Items Addressed This Visit   ?None ?Visit Diagnoses   ? ? Gonorrhea    -  Primary  ? Discussed that gonorrhea is a sexually transmitted illness, partner will need to be tested  ? Relevant Medications  ? metroNIDAZOLE (FLAGYL) 500 MG  tablet  ? cefTRIAXone (ROCEPHIN) injection 500 mg (Completed)  ? Bacterial vaginosis      ? Relevant Medications  ? metroNIDAZOLE (FLAGYL) 500 MG tablet  ? cefTRIAXone (ROCEPHIN) injection 500 mg (Completed)  ? ?  ?  ? ?Follow up plan: ?Return if symptoms worsen or fail to improve. ? ? ? ? ? ?

## 2022-03-04 ENCOUNTER — Ambulatory Visit: Payer: Self-pay | Admitting: Advanced Practice Midwife

## 2022-03-04 ENCOUNTER — Encounter: Payer: Self-pay | Admitting: Advanced Practice Midwife

## 2022-03-04 DIAGNOSIS — Z202 Contact with and (suspected) exposure to infections with a predominantly sexual mode of transmission: Secondary | ICD-10-CM | POA: Insufficient documentation

## 2022-03-04 DIAGNOSIS — Z9851 Tubal ligation status: Secondary | ICD-10-CM

## 2022-03-04 DIAGNOSIS — Z62810 Personal history of physical and sexual abuse in childhood: Secondary | ICD-10-CM

## 2022-03-04 DIAGNOSIS — A749 Chlamydial infection, unspecified: Secondary | ICD-10-CM

## 2022-03-04 DIAGNOSIS — A549 Gonococcal infection, unspecified: Secondary | ICD-10-CM

## 2022-03-04 DIAGNOSIS — Z113 Encounter for screening for infections with a predominantly sexual mode of transmission: Secondary | ICD-10-CM

## 2022-03-04 LAB — HM HEPATITIS C SCREENING LAB: HM Hepatitis Screen: NEGATIVE

## 2022-03-04 LAB — HM HIV SCREENING LAB: HM HIV Screening: NEGATIVE

## 2022-03-04 MED ORDER — PENICILLIN G BENZATHINE 1200000 UNIT/2ML IM SUSY
1.2000 10*6.[IU] | PREFILLED_SYRINGE | Freq: Once | INTRAMUSCULAR | Status: AC
  Administered 2022-03-04: 1.2 10*6.[IU] via INTRAMUSCULAR

## 2022-03-04 NOTE — Progress Notes (Signed)
[facility] Department ? ?STI clinic/screening visit ?[address] ?[place] [address] ?[phone] ? ?Subjective:  ?Kristina Suarez is a 31 y.o. MBF G3P3 exsmoker  female being seen today for an STI screening visit. The patient reports they do not have symptoms.  Patient reports that they do not desire a pregnancy in the next year.   They reported they are not interested in discussing contraception today.   ? ?Patient's last menstrual period was 02/18/2022. ? ? ?Patient has the following medical conditions:   ?Patient Active Problem List  ? Diagnosis Date Noted  ? Fetal cardiac echogenic focus, antepartum 02/27/2015  ? Contraception management : [facility] B[facility] desired 12/25/2014  ? 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 ? ?Chief Complaint  ?Patient presents with  ? SEXUALLY TRANSMITTED DISEASE  ?  Pt states she got a call from state HD and needed treatment for syphilis  ? ? ?HPI ? ?Patient reports was called by the state to come in for treatment as contact to syphilis. Husband was treated 02/13/22.  Her last RPR on 04/29/15 NR last sex 02/11/22 with condom; with current partner x 12 years; 1 partner in last 3 mo. LMP 02/15/22. BTL 2016. Last cig 2 years ago. Last MJ 12/2021. Last ETOH last night (1 1/2 glass wine) 3x/wk. States seen by Kristina Goo, FNP last week 02/25/22 and currently being treated for BV with Metronidazole and still has a few more days of tx left. Spoke to [place] at the state; consulted Kristina Rising, FNP ? ?Last HIV test per patient/review of record was 02/08/15 ?Patient reports last pap was 2017 neg per pt ? ?Screening for MPX risk: ?Does the patient have an unexplained rash? No ?Is the patient MSM? No ?Does the patient endorse multiple sex partners or anonymous sex partners? No ?Did the patient have close or sexual contact with a person diagnosed with MPX? No ?Has the patient traveled outside the [place] where MPX is endemic? No ?Is there a high clinical suspicion for MPX-- evidenced by one of the  following No ? -Unlikely to be chickenpox ? -Lymphadenopathy ? -Rash that present in same phase of evolution on any given body part ?See flowsheet for further details and programmatic requirements.  ? ?Immunization history:  ?Immunization History  ?Administered Date(s) Administered  ? Influenza,inj,Quad PF,6+ Mos 11/06/2014  ? Tdap 02/21/2022  ?  ? ?The following portions of the patient's history were reviewed and updated as appropriate: allergies, current medications, past medical history, past social history, past surgical history and problem list. ? ?Objective:  ?There were no vitals filed for this visit. ? ?Physical Exam ?Vitals and nursing note reviewed.  ?Constitutional:   ?   Appearance: Normal appearance. She is normal weight.  ?HENT:  ?   Head: Normocephalic and atraumatic.  ?   Mouth/Throat:  ?   Mouth: Mucous membranes are moist.  ?   Pharynx: Oropharynx is clear. No oropharyngeal exudate or posterior oropharyngeal erythema.  ?Pulmonary:  ?   Effort: Pulmonary effort is normal.  ?Abdominal:  ?   Palpations: There is no mass.  ?   Tenderness: There is no abdominal tenderness. There is no rebound.  ?Genitourinary: ?   Pubic Area: No rash or pubic lice.   ?   Labia:     ?   Right: No rash or lesion.     ?   Left: No rash or lesion.   ?   Vagina: No vaginal discharge, erythema, bleeding or lesions.  ?  Cervix: No cervical motion tenderness, discharge, friability, lesion or erythema.  ?   Comments: Pt refused exam because just had done 02/25/22 ?Lymphadenopathy:  ?   Head:  ?   Right side of head: No preauricular or posterior auricular adenopathy.  ?   Left side of head: No preauricular or posterior auricular adenopathy.  ?   Cervical: No cervical adenopathy.  ?   Upper Body:  ?   Right upper body: No supraclavicular or axillary adenopathy.  ?   Left upper body: No supraclavicular or axillary adenopathy.  ?   Lower Body: No right inguinal adenopathy. No left inguinal adenopathy.  ?Skin: ?   General: Skin is  warm and dry.  ?   Findings: No rash.  ?Neurological:  ?   Mental Status: She is alert and oriented to person, place, and time.  ? ? ? ?Assessment and Plan:  ?Kristina Suarez is a 31 y.o. female presenting to the [facility] Department for STI screening ? ?1. Screening examination for venereal disease ?Treat for syphilis x1 today as contact per standing orders ?- HIV/HCV [place] [facility] ?- Syphilis Serology, [place] [facility] ? ? ? ? ?No follow-ups on file. ? ?Future Appointments  ?Date Time Provider Department Center  ?05/23/2022  9:40 AM Kristina Herald Rudolpho Sevin, FNP [facility]  ? ? ?Alberteen Spindle, CNM ?

## 2022-03-04 NOTE — Progress Notes (Signed)
Patient here for STD testing and Bicillin tx. Medication given. Condoms given.  ?

## 2022-05-08 ENCOUNTER — Telehealth: Payer: Self-pay | Admitting: Nurse Practitioner

## 2022-05-08 ENCOUNTER — Other Ambulatory Visit: Payer: Self-pay | Admitting: Nurse Practitioner

## 2022-05-08 NOTE — Telephone Encounter (Signed)
Copied from CRM [id number]. Topic: General - Other >> May 08, 2022 12:07 PM De Blanch wrote: Reason for CRM: Pt is calling back to follow up and ask if the PCP sent an e-Signature for the referral appointment for her breast exam.   Pt stated she has not heard back.

## 2022-05-08 NOTE — Telephone Encounter (Signed)
Referral says need esign

## 2022-05-09 NOTE — Telephone Encounter (Signed)
Faxed to [facility]  

## 2022-05-23 ENCOUNTER — Encounter: Payer: BC Managed Care – PPO | Admitting: Nurse Practitioner

## 2022-07-10 ENCOUNTER — Encounter: Payer: BC Managed Care – PPO | Admitting: Nurse Practitioner

## 2022-08-18 ENCOUNTER — Encounter: Payer: BC Managed Care – PPO | Admitting: Nurse Practitioner
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
## 2022-08-21 ENCOUNTER — Encounter: Payer: Self-pay | Admitting: Nurse Practitioner

## 2022-08-21 ENCOUNTER — Other Ambulatory Visit (HOSPITAL_COMMUNITY)
Admission: RE | Admit: 2022-08-21 | Discharge: 2022-08-21 | Disposition: A | Discharge status: Home / Self Care | Payer: BC Managed Care – PPO | Source: Ambulatory Visit | Attending: Nurse Practitioner | Admitting: Nurse Practitioner

## 2022-08-21 ENCOUNTER — Other Ambulatory Visit: Payer: Self-pay

## 2022-08-21 ENCOUNTER — Ambulatory Visit (INDEPENDENT_AMBULATORY_CARE_PROVIDER_SITE_OTHER): Payer: BC Managed Care – PPO | Admitting: Nurse Practitioner

## 2022-08-21 VITALS — BP 118/68 | HR 83 | Temp 98.1°F | Resp 16 | Ht 68.0 in | Wt 124.5 lb

## 2022-08-21 DIAGNOSIS — Z23 Encounter for immunization: Secondary | ICD-10-CM

## 2022-08-21 DIAGNOSIS — Z124 Encounter for screening for malignant neoplasm of cervix: Secondary | ICD-10-CM | POA: Diagnosis not present

## 2022-08-21 DIAGNOSIS — Z0000 Encounter for general adult medical examination without abnormal findings: Secondary | ICD-10-CM | POA: Diagnosis not present

## 2022-08-21 DIAGNOSIS — N6312 Unspecified lump in the right breast, upper inner quadrant: Secondary | ICD-10-CM
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
## 2022-08-25 LAB — CYTOLOGY - PAP
Comment: NEGATIVE
Diagnosis: NEGATIVE
High risk HPV: NEGATIVE

## 2022-09-05 ENCOUNTER — Ambulatory Visit
Admission: RE | Admit: 2022-09-05 | Discharge: 2022-09-05 | Disposition: A | Discharge status: Home / Self Care | Payer: BC Managed Care – PPO | Source: Ambulatory Visit | Attending: Nurse Practitioner | Admitting: Nurse Practitioner

## 2022-09-05 DIAGNOSIS — N6312 Unspecified lump in the right breast, upper inner quadrant: Secondary | ICD-10-CM

## 2022-09-05 DIAGNOSIS — N6489 Other specified disorders of breast: Secondary | ICD-10-CM | POA: Diagnosis not present

## 2022-09-05 DIAGNOSIS — R9230 Dense breasts, unspecified: Secondary | ICD-10-CM | POA: Diagnosis not present

## 2023-03-18 ENCOUNTER — Other Ambulatory Visit: Payer: Self-pay | Admitting: Nurse Practitioner

## 2023-03-18 DIAGNOSIS — R928 Other abnormal and inconclusive findings on diagnostic imaging of breast: Secondary | ICD-10-CM

## 2023-03-18 DIAGNOSIS — N63 Unspecified lump in unspecified breast: Secondary | ICD-10-CM

## 2024-04-26 ENCOUNTER — Ambulatory Visit (INDEPENDENT_AMBULATORY_CARE_PROVIDER_SITE_OTHER): Admitting: Nurse Practitioner

## 2024-04-26 ENCOUNTER — Other Ambulatory Visit (HOSPITAL_COMMUNITY)
Admission: RE | Admit: 2024-04-26 | Discharge: 2024-04-26 | Disposition: A | Discharge status: Home / Self Care | Source: Ambulatory Visit | Attending: Nurse Practitioner | Admitting: Nurse Practitioner

## 2024-04-26 VITALS — BP 98/66 | HR 80 | Resp 16 | Ht 66.75 in | Wt 124.6 lb

## 2024-04-26 DIAGNOSIS — A5901 Trichomonal vulvovaginitis: Secondary | ICD-10-CM | POA: Diagnosis not present

## 2024-04-26 DIAGNOSIS — Z131 Encounter for screening for diabetes mellitus: Secondary | ICD-10-CM | POA: Diagnosis not present

## 2024-04-26 DIAGNOSIS — N939 Abnormal uterine and vaginal bleeding, unspecified: Secondary | ICD-10-CM | POA: Diagnosis not present

## 2024-04-26 DIAGNOSIS — Z130 Encounter for screening for diseases of the blood and blood-forming organs and certain disorders involving the immune mechanism: Secondary | ICD-10-CM

## 2024-04-26 DIAGNOSIS — B9689 Other specified bacterial agents as the cause of diseases classified elsewhere: Secondary | ICD-10-CM | POA: Diagnosis not present

## 2024-04-26 DIAGNOSIS — Z13220 Encounter for screening for lipoid disorders: Secondary | ICD-10-CM | POA: Diagnosis not present

## 2024-04-26 DIAGNOSIS — Z113 Encounter for screening for infections with a predominantly sexual mode of transmission: Secondary | ICD-10-CM | POA: Diagnosis not present

## 2024-04-26 DIAGNOSIS — N760 Acute vaginitis: Secondary | ICD-10-CM | POA: Diagnosis not present

## 2024-04-26 DIAGNOSIS — Z0000 Encounter for general adult medical examination without abnormal findings: Secondary | ICD-10-CM | POA: Diagnosis not present

## 2024-04-26 LAB — POCT URINALYSIS DIPSTICK
Bilirubin, UA: NEGATIVE
Glucose, UA: NEGATIVE
Ketones, UA: NEGATIVE
Nitrite, UA: NEGATIVE
Protein, UA: NEGATIVE
Spec Grav, UA: 1.01 (ref 1.010–1.025)
Urobilinogen, UA: 4 U/dL — AB
pH, UA: 7 (ref 5.0–8.0)

## 2024-04-26 LAB — POCT URINE PREGNANCY: Preg Test, Ur: NEGATIVE
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
## 2024-04-27 ENCOUNTER — Ambulatory Visit: Payer: Self-pay | Admitting: Nurse Practitioner

## 2024-04-27 DIAGNOSIS — B9689 Other specified bacterial agents as the cause of diseases classified elsewhere: Secondary | ICD-10-CM

## 2024-04-27 DIAGNOSIS — A5901 Trichomonal vulvovaginitis: Secondary | ICD-10-CM

## 2024-04-27 LAB — URINE CULTURE
MICRO NUMBER:: 16647563
Result:: NO GROWTH
SPECIMEN QUALITY:: ADEQUATE

## 2024-04-27 LAB — HEMOGLOBIN A1C
Hgb A1c MFr Bld: 5.2 % (ref <5.7)
Mean Plasma Glucose: 103 mg/dL
eAG (mmol/L): 5.7 mmol/L

## 2024-04-27 LAB — LIPID PANEL
Cholesterol: 143 mg/dL (ref <200)
HDL: 47 mg/dL — ABNORMAL LOW (ref >=50)
LDL Cholesterol (Calc): 83 mg/dL
Non-HDL Cholesterol (Calc): 96 mg/dL (ref <130)
Total CHOL/HDL Ratio: 3 (calc) (ref <5.0)
Triglycerides: 51 mg/dL (ref <150)

## 2024-04-27 LAB — COMPREHENSIVE METABOLIC PANEL WITH GFR
AG Ratio: 1.8 (calc) (ref 1.0–2.5)
ALT: 5 U/L — ABNORMAL LOW (ref 6–29)
AST: 13 U/L (ref 10–30)
Albumin: 4.5 g/dL (ref 3.6–5.1)
Alkaline phosphatase (APISO): 47 U/L (ref 31–125)
BUN: 13 mg/dL (ref 7–25)
CO2: 26 mmol/L (ref 20–32)
Calcium: 9.3 mg/dL (ref 8.6–10.2)
Chloride: 106 mmol/L (ref 98–110)
Creat: 0.67 mg/dL (ref 0.50–0.97)
Globulin: 2.5 g/dL (ref 1.9–3.7)
Glucose, Bld: 84 mg/dL (ref 65–99)
Potassium: 4.2 mmol/L (ref 3.5–5.3)
Sodium: 139 mmol/L (ref 135–146)
Total Bilirubin: 0.7 mg/dL (ref 0.2–1.2)
Total Protein: 7 g/dL (ref 6.1–8.1)
eGFR: 118 mL/min/1.73m2 (ref >=60)

## 2024-04-27 LAB — CBC WITH DIFFERENTIAL/PLATELET
Absolute Lymphocytes: 3571 {cells}/uL (ref 850–3900)
Absolute Monocytes: 611 {cells}/uL (ref 200–950)
Basophils Absolute: 50 {cells}/uL (ref 0–200)
Basophils Relative: 0.7 %
Eosinophils Absolute: 99 {cells}/uL (ref 15–500)
Eosinophils Relative: 1.4 %
HCT: 39.8 % (ref 35.0–45.0)
Hemoglobin: 12.9 g/dL (ref 11.7–15.5)
MCH: 30.6 pg (ref 27.0–33.0)
MCHC: 32.4 g/dL (ref 32.0–36.0)
MCV: 94.3 fL (ref 80.0–100.0)
MPV: 11.6 fL (ref 7.5–12.5)
Monocytes Relative: 8.6 %
Neutro Abs: 2769 {cells}/uL (ref 1500–7800)
Neutrophils Relative %: 39 %
Platelets: 223 Thousand/uL (ref 140–400)
RBC: 4.22 10*6/uL (ref 3.80–5.10)
RDW: 12.1 % (ref 11.0–15.0)
Total Lymphocyte: 50.3 %
WBC: 7.1 Thousand/uL (ref 3.8–10.8)

## 2024-04-27 LAB — TSH: TSH: 3.01 m[IU]/L

## 2024-04-28 LAB — CERVICOVAGINAL ANCILLARY ONLY
Bacterial Vaginitis (gardnerella): POSITIVE — AB
Candida Glabrata: NEGATIVE
Candida Vaginitis: NEGATIVE
Chlamydia: NEGATIVE
Comment: NEGATIVE
Comment: NEGATIVE
Comment: NEGATIVE
Comment: NEGATIVE
Comment: NEGATIVE
Comment: NORMAL
Neisseria Gonorrhea: NEGATIVE
Trichomonas: POSITIVE — AB

## 2024-04-28 MED ORDER — METRONIDAZOLE 500 MG PO TABS: 500.0000 mg | ORAL_TABLET | Freq: Two times a day (BID) | ORAL | 0 refills | Status: AC

## 2024-11-01 ENCOUNTER — Telehealth: Admitting: Family Medicine

## 2024-11-01 DIAGNOSIS — B3731 Acute candidiasis of vulva and vagina: Secondary | ICD-10-CM | POA: Diagnosis not present

## 2024-11-01 MED ORDER — FLUCONAZOLE 150 MG PO TABS: 150.0000 mg | ORAL_TABLET | ORAL | 0 refills | Status: AC

## 2024-11-01 NOTE — Progress Notes (Signed)

## 2025-05-01 ENCOUNTER — Encounter: Admitting: Nurse Practitioner
